# Patient Record
Sex: Male | Born: 1960 | Race: White | Hispanic: No | Marital: Married | Smoking: Former smoker
Health system: Southern US, Community
[De-identification: ages and names within clinical notes are randomized; demographics above are authoritative.]

## PROBLEM LIST (undated history)

## (undated) DIAGNOSIS — K219 Gastro-esophageal reflux disease without esophagitis: Secondary | ICD-10-CM

## (undated) DIAGNOSIS — G459 Transient cerebral ischemic attack, unspecified: Secondary | ICD-10-CM

## (undated) DIAGNOSIS — Q211 Atrial septal defect: Secondary | ICD-10-CM

## (undated) DIAGNOSIS — U071 COVID-19: Secondary | ICD-10-CM

## (undated) DIAGNOSIS — H269 Unspecified cataract: Secondary | ICD-10-CM

## (undated) DIAGNOSIS — G4730 Sleep apnea, unspecified: Secondary | ICD-10-CM

## (undated) DIAGNOSIS — E785 Hyperlipidemia, unspecified: Secondary | ICD-10-CM

## (undated) DIAGNOSIS — I10 Essential (primary) hypertension: Secondary | ICD-10-CM

## (undated) DIAGNOSIS — Q2112 Patent foramen ovale: Secondary | ICD-10-CM

## (undated) DIAGNOSIS — N189 Chronic kidney disease, unspecified: Secondary | ICD-10-CM

## (undated) HISTORY — DX: Sleep apnea, unspecified: G47.30

## (undated) HISTORY — DX: Unspecified cataract: H26.9

## (undated) HISTORY — DX: Atrial septal defect: Q21.1

## (undated) HISTORY — DX: Essential (primary) hypertension: I10

## (undated) HISTORY — DX: Gastro-esophageal reflux disease without esophagitis: K21.9

## (undated) HISTORY — PX: POLYPECTOMY: SHX149

## (undated) HISTORY — PX: COLONOSCOPY: SHX174

## (undated) HISTORY — DX: Patent foramen ovale: Q21.12

## (undated) HISTORY — PX: LAMINECTOMY: SHX219

## (undated) HISTORY — PX: HERNIA REPAIR: SHX51

## (undated) HISTORY — DX: Hyperlipidemia, unspecified: E78.5

## (undated) HISTORY — DX: Chronic kidney disease, unspecified: N18.9

## (undated) HISTORY — DX: COVID-19: U07.1

---

## 2000-03-28 DIAGNOSIS — G4730 Sleep apnea, unspecified: Secondary | ICD-10-CM | POA: Insufficient documentation

## 2001-06-26 DIAGNOSIS — R319 Hematuria, unspecified: Secondary | ICD-10-CM | POA: Insufficient documentation

## 2001-06-26 DIAGNOSIS — Z720 Tobacco use: Secondary | ICD-10-CM | POA: Insufficient documentation

## 2003-12-05 ENCOUNTER — Encounter: Admission: RE | Admit: 2003-12-05 | Discharge: 2003-12-05 | Payer: Self-pay | Admitting: Pain Medicine

## 2006-08-22 ENCOUNTER — Ambulatory Visit (HOSPITAL_COMMUNITY): Admission: RE | Admit: 2006-08-22 | Discharge: 2006-08-22 | Payer: Self-pay | Admitting: General Surgery

## 2008-04-08 ENCOUNTER — Emergency Department (HOSPITAL_COMMUNITY): Admission: EM | Admit: 2008-04-08 | Discharge: 2008-04-08 | Payer: Self-pay | Admitting: Emergency Medicine

## 2008-04-08 ENCOUNTER — Ambulatory Visit: Payer: Self-pay | Admitting: Internal Medicine

## 2011-03-05 NOTE — Consult Note (Signed)
NAME:  Gabriel Lin, Gabriel Lin NO.:  [id number]   MEDICAL RECORD NO.:  [id number]          PATIENT TYPE:  EMS   LOCATION:  ED                           FACILITY:  [facility]   PHYSICIAN:  Bevelyn Buckles. Bensimhon, MDDATE OF BIRTH:  1960/12/04   DATE OF CONSULTATION:  04/08/2008  DATE OF DISCHARGE:                                 CONSULTATION   CONSULTING PHYSICIAN:  [facility].   REASON FOR CONSULTATION:  Chest pain.   HISTORY:  Gabriel Lin is a very pleasant 50 year old male with a  history of obesity, hypertension, obstructive sleep apnea,  hyperlipidemia and ongoing tobacco use.  He denies any history of known  heart disease.  He has never had a cardiac catheterization previously or  stress test.  Last Saturday, he went to the [facility] and ate a very  large greasy breakfast.  About 1 or 1-/12 hours after this, he developed  severe lower sternal epigastric pain radiating up to his neck.  He felt  sick to his stomach.  He went to [facility] and took some baby aspirin, and  felt a little better.  However the next morning, he noticed this gravely  feeling in the back of his throat which is consistent with his reflux  disease.  So then he went out and started some Prilosec.   Since that time, he has been feeling pretty well.  He has been  ambulating without any chest pain or shortness of breath.  Over the past  2 days, he has developed some vague tingling in his left hand.  He  denied any change in his vision, his speech or his motor strength.  He  became quite anxious about this and was worried he might be having a  heart attack.  He then got a little bit of chest discomfort, so he came  to emergency room.  In the emergency room, his EKG showed sinus  tachycardia at a rate of 105.  His blood pressure was elevated initially  at 179/117.  He was given something for anxiety and now feels better.   REVIEW OF SYSTEMS:  As per HPI and problem list, otherwise negative.   He  denies any melena.  No bright red blood per rectum.  No hematemesis.  No  nausea, vomiting or diarrhea.   PAST MEDICAL HISTORY:  1. Obesity.  2. Hypertension.  3. Obstructive sleep apnea.  4. Hyperlipidemia.  5. Gastroesophageal reflux disease.   PAST SURGICAL HISTORY:  1. Status post bilateral inguinal hernia repairs.  2. Status post back surgery.   CURRENT MEDICATIONS:  1. Prilosec.  2. Aspirin.   ALLERGIES:  NO KNOWN ALLERGIES.   SOCIAL HISTORY:  He lives in [place].  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.  He has a history of tobacco just over  a pack a day for 25 years.  Minimal alcohol.   FAMILY HISTORY:  Father is alive and well at 50.  Mother has diabetes.  She is alive.  Brother and sister are alive and well.  There is no  family history of premature coronary artery disease.   PHYSICAL EXAMINATION:  GENERAL:  He is in no acute distress.  He does  seem like a high-stress guy, but is currently comfortable.  VITAL SIGNS:  Blood pressure is now 150/81, heart rate is 90.  He is  satting 98% on 2 liters.  He is afebrile.  Weight is 223 pounds.  HEENT:  Normal.  NECK:  Supple.  There is no JVD.  Carotids are 2+ bilaterally without  bruits.  There is no lymphadenopathy or thyromegaly.  CARDIAC:  PMI is nondisplaced.  He is regular with no murmurs, rubs or  gallops.  LUNGS:  Clear.  ABDOMEN:  Obese, nontender, nondistended.  No hepatosplenomegaly.  No  bruits.  No masses.  Good bowel sounds.  EXTREMITIES:  Warm with no cyanosis, clubbing or edema.  No rash.  Good  distal pulses.  NEURO:  Alert and oriented x3.  Cranial nerves II-XII are intact.  Moves  all 4 extremities without difficulty.  Affect is pleasant.   DIAGNOSTICS:  1. Chest x-ray is normal.  2. EKG shows sinus tachycardia at a rate of 105, no ST/T wave changes.   LABORATORY DATA:  Point of care labs shows hemoglobin 18.7, hematocrit  of 55, sodium 137, potassium 4.0, BUN  20, creatinine 1.3, glucose 102.  He has 2 sets of cardiac markers which are totally normal with a  troponin of less than 0.05.   ASSESSMENT:  1. Chest and epigastric pain.  2. Tobacco use.  3. Hypertension.  4. Anxiety.  5. Gastroesophageal reflux disease.   DISPOSITION:  1. Although Gabriel Lin certainly has risk factors for coronary artery      disease, I think his symptoms are most consistent with reflux      disease.  There is no current evidence of ischemia.  I think he is      safe for discharge.  We will have him double up on his Prilosec to      make it 20 b.i.d.  He will continue his aspirin.  I strongly      advised him to stop smoking and be more careful about his diet.      Given his risk factors, he will need a stress test.  However, he is      not going to have insurance until August.  I think he is safe to      wait until then unless he has recurrent symptoms.  If he does so,      he needs to call me.  We will give him the office number.  We will      also start him on      low-dose Xanax p.r.n. for anxiety.  2. Hypertension.  This is markedly elevated probably due to anxiety.      We will start him on Norvasc 2.5 mg a day.  We did consider a beta      blocker, but he was reluctant given the side effects of erectile      dysfunction.      Bevelyn Buckles. Bensimhon, MD  Electronically Signed     DRB/MEDQ  D:  04/08/2008  T:  04/08/2008  Job:  [id number]

## 2011-03-08 NOTE — Op Note (Signed)
NAME:  Gabriel Lin, EKSTEIN NO.:  [id number]   MEDICAL RECORD NO.:  [id number]          PATIENT TYPE:  AMB   LOCATION:  SDS                          FACILITY:  [facility]   PHYSICIAN:  Ollen Gross. Vernell Morgans, M.D. DATE OF BIRTH:  May 02, 1961   DATE OF PROCEDURE:  08/22/2006  DATE OF DISCHARGE:  08/22/2006                               OPERATIVE REPORT   PREOPERATIVE DIAGNOSIS:  Bilateral inguinal herniae.   POSTOPERATIVE DIAGNOSIS:  Bilateral inguinal herniae.   PROCEDURE:  Bilateral inguinal hernia repairs with mesh.   SURGEON:  Ollen Gross. Carolynne Edouard, M.D.   ANESTHESIA:  General.   DESCRIPTION OF PROCEDURE:  After informed consent was obtained, the  patient was brought to the operating and placed in the supine position  on the operating table.  After induction of general anesthesia, the  patient's abdomen and both groins were prepped with Betadine and draped  in the usual sterile manner.  Attention was turned first to the left  groin. This area was infiltrated with 0.25% Marcaine with epinephrine.  A small incision was made from the edge of the pubic tubercle on the  left towards the anterior superior iliac spine for a distance of about 5  cm. This incision was carried down through the skin and subcutaneous  tissue sharply with electrocautery.  A small bridging vein was clamped  with hemostats, divided, and ligated with 3-0 silk ties.  The dissection  was then carried deep through the subcutaneous tissue sharply with  electrocautery until the fascia of the external oblique was encountered.  The fascia of the external oblique was opened along its fibers towards  the apex of the external ring.  A Weitlaner retractor was deployed.  Blunt dissection was carried out of the cord structures at the edge of  the pubic tubercle until the cord structures could be surrounded between  two fingers.  A 1/2-inch Penrose drain was then placed around the cord  structures for retraction purposes.  The  cord was gently skeletonized.  The patient had a direct defect but no indirect defect.  The floor of  the hernia sac was then reduced easily.  The floor of the inguinal canal  was then reapproximated with a running 2-0 Prolene stitch.  The tails of  the stitch were left long at the edge of the cord.  Next, a 3 by 6 piece  of Prolene mesh was chosen and cut to fit. The mesh was sewed inferiorly  to the shelving edge of the inguinal ligament with a running 2-0 Prolene  stitch.  The tails were cut in the mesh laterally and the long ends of  the Prolene that was used to repair the floor of the canal were brought  through the mesh at the edge of the cut tails and tied. The tails were  wrapped around the cord structures laterally.  The mesh was then sewed  superiorly to the transversalis with interrupted 2-0 Prolene vertical  mattress stitches.  The tails of the mesh were anchored lateral to the  cord to the shelving edge of inguinal ligament with interrupted  2-0  Prolene stitch.  Once this was accomplished, the mesh was in good  position without any tension.  The ilioinguinal nerve was identified and  was clamped proximally and distally with hemostats, divided and ligated  with 3-0 silk ties.  The wound was then irrigated with copious amounts  of saline.  The external oblique was then reapproximated with a running  2-0 Vicryl stitch.  The subcutaneous tissue was reapproximated with a  running 3-0 Vicryl stitch.  The skin was closed with a running 4-0  Monocryl subcuticular stitch.   Attention was then turned to the right side and, again, the right groin  was infiltrated 0.25% Marcaine with epinephrine.  A small incision was  made from the edge of the pubic tubercle on the right towards the  anterior superior spine for a distance of about 5 cm. This incision was  carried down through the skin and subcutaneous tissue sharply with  electrocautery until the fascia of the external oblique was  encountered.  A small bridging vein was clamped with hemostats, divided and ligated  with 3-0 silk ties.  The external oblique was opened along its fibers  towards the apex of the external ring with a 15 blade knife and  Metzenbaum scissors.  A Weitlaner retractor was then deployed.  Blunt  dissection was then carried out at the edge of the pubic tubercle until  the cord structures could be surrounded between two fingers.  A 1/2-inch  Penrose drain was then placed around the cord structures for retraction  purposes.  The cord was skeletonized and there appeared to be, again,  another direct defect but no indirect sac.  The direct hernia was then  easily reduced and the floor of the inguinal canal was repaired with a  running 2-0 Prolene stitch.  The tails of the Prolene were left long at  the edge of the cord.  A piece of about 3 x 6 Prolene mesh was chosen  and cut to fit. The mesh was sewed inferiorly to the shelving edge of  the inguinal ligament with running 2-0 Prolene stitch.  The tails were  cut in the mesh laterally and the long ends of the Prolene at the edge  of the cord were brought through the mesh and tied.  The mesh was then  sewed superiorly to the transversalis with interrupted 2-0 Prolene  vertical mattress stitches.  The tails of mesh were wrapped laterally  around the cord and anchored lateral to the cord to the shelving edge of  the inguinal ligament with interrupted 2-0 Prolene stitch.  The  ilioinguinal nerve was also identified and was clamped proximally and  distally with hemostats, divided, and ligated 3-0 silk ties.  The wound  was then irrigated with copious amounts of saline. The external oblique  fascia was closed with a running 2-0 Vicryl stitch.  The subcutaneous  fascia was closed with a running 3-0 Vicryl stitch.  The skin was closed  with a running 4-0 Monocryl subcuticular stitch.  The rest of the 0.25% Marcaine was infiltrated into both wound before the  final closure.  Benzoin, Steri-Strips and sterile dressings were applied.  The patient  tolerated the procedure well.  At the end of the case, all needle,  sponge, and instrument counts were correct.  The patient was then  awakened and taken to recovery in stable condition.  The patient's  testicles were in the scrotum at the end of the case.  Ollen Gross. Vernell Morgans, M.D.  Electronically Signed     PST/MEDQ  D:  08/27/2006  T:  08/27/2006  Job:  [id number]

## 2011-07-18 LAB — POCT I-STAT, CHEM 8
Chloride: 106
Creatinine, Ser: 1.3
Glucose, Bld: 102 — ABNORMAL HIGH
Potassium: 4
Sodium: 137

## 2011-07-18 LAB — POCT CARDIAC MARKERS
CKMB, poc: <1 — ABNORMAL LOW
Myoglobin, poc: 109
Myoglobin, poc: 68.1
Operator id: 264031
Operator id: 264031

## 2011-09-23 DIAGNOSIS — E7800 Pure hypercholesterolemia, unspecified: Secondary | ICD-10-CM | POA: Insufficient documentation

## 2011-09-23 DIAGNOSIS — K219 Gastro-esophageal reflux disease without esophagitis: Secondary | ICD-10-CM | POA: Insufficient documentation

## 2012-08-17 DIAGNOSIS — I10 Essential (primary) hypertension: Secondary | ICD-10-CM | POA: Insufficient documentation

## 2016-05-10 DIAGNOSIS — F418 Other specified anxiety disorders: Secondary | ICD-10-CM | POA: Insufficient documentation

## 2016-07-24 DIAGNOSIS — Z79899 Other long term (current) drug therapy: Secondary | ICD-10-CM | POA: Insufficient documentation

## 2016-07-24 DIAGNOSIS — Z7185 Encounter for immunization safety counseling: Secondary | ICD-10-CM | POA: Insufficient documentation

## 2017-05-07 DIAGNOSIS — N183 Chronic kidney disease, stage 3 unspecified: Secondary | ICD-10-CM | POA: Insufficient documentation

## 2018-08-17 DIAGNOSIS — K760 Fatty (change of) liver, not elsewhere classified: Secondary | ICD-10-CM | POA: Insufficient documentation

## 2020-02-17 ENCOUNTER — Telehealth: Payer: Self-pay | Admitting: Internal Medicine

## 2020-02-17 NOTE — Telephone Encounter (Signed)
Hey Dr. Carlean Purl- this patient is being referred to [place] for a colonoscopy. Last colonoscopy mwas at [facility] in 2016. This patient is requesting you as hes doctor. I am going to send you the records for review. Please advise. Thank you!

## 2020-02-22 NOTE — Telephone Encounter (Signed)
Called patient to schedule- phone rang a few times and went to busy signal. Will try back to get patient scheduled.

## 2020-02-22 NOTE — Telephone Encounter (Signed)
Sent papers back to you - ok to schedule colonoscopy

## 2020-04-19 ENCOUNTER — Encounter: Payer: Self-pay | Admitting: Internal Medicine

## 2020-04-26 ENCOUNTER — Emergency Department (HOSPITAL_COMMUNITY): Payer: BC Managed Care – PPO

## 2020-04-26 ENCOUNTER — Other Ambulatory Visit: Payer: Self-pay

## 2020-04-26 ENCOUNTER — Encounter (HOSPITAL_COMMUNITY): Payer: Self-pay | Admitting: Emergency Medicine

## 2020-04-26 ENCOUNTER — Emergency Department (HOSPITAL_COMMUNITY)
Admission: EM | Admit: 2020-04-26 | Discharge: 2020-04-26 | Disposition: A | Discharge status: Home / Self Care | Payer: BC Managed Care – PPO | Attending: Emergency Medicine | Admitting: Emergency Medicine

## 2020-04-26 DIAGNOSIS — N189 Chronic kidney disease, unspecified: Secondary | ICD-10-CM | POA: Diagnosis not present

## 2020-04-26 DIAGNOSIS — R079 Chest pain, unspecified: Secondary | ICD-10-CM | POA: Insufficient documentation

## 2020-04-26 DIAGNOSIS — I129 Hypertensive chronic kidney disease with stage 1 through stage 4 chronic kidney disease, or unspecified chronic kidney disease: Secondary | ICD-10-CM | POA: Insufficient documentation

## 2020-04-26 DIAGNOSIS — R030 Elevated blood-pressure reading, without diagnosis of hypertension: Secondary | ICD-10-CM

## 2020-04-26 LAB — CBC
HCT: 50.3 % (ref 39.0–52.0)
Hemoglobin: 17.6 g/dL — ABNORMAL HIGH (ref 13.0–17.0)
MCH: 32 pg (ref 26.0–34.0)
MCHC: 35 g/dL (ref 30.0–36.0)
MCV: 91.5 fL (ref 80.0–100.0)
Platelets: 165 10*3/uL (ref 150–400)
RBC: 5.5 MIL/uL (ref 4.22–5.81)
RDW: 12.3 % (ref 11.5–15.5)
WBC: 7.2 10*3/uL (ref 4.0–10.5)
nRBC: 0 % (ref 0.0–0.2)

## 2020-04-26 LAB — BASIC METABOLIC PANEL
Anion gap: 9 (ref 5–15)
BUN: 30 mg/dL — ABNORMAL HIGH (ref 6–20)
CO2: 27 mmol/L (ref 22–32)
Calcium: 9.1 mg/dL (ref 8.9–10.3)
Chloride: 100 mmol/L (ref 98–111)
Creatinine, Ser: 1.53 mg/dL — ABNORMAL HIGH (ref 0.61–1.24)
GFR calc Af Amer: 56 mL/min — ABNORMAL LOW (ref >60)
GFR calc non Af Amer: 49 mL/min — ABNORMAL LOW (ref >60)
Glucose, Bld: 96 mg/dL (ref 70–99)
Potassium: 4.5 mmol/L (ref 3.5–5.1)
Sodium: 136 mmol/L (ref 135–145)

## 2020-04-26 LAB — TROPONIN I (HIGH SENSITIVITY)
Troponin I (High Sensitivity): 3 ng/L (ref <18)
Troponin I (High Sensitivity): 3 ng/L (ref <18)

## 2020-04-26 MED ORDER — SODIUM CHLORIDE 0.9% FLUSH: 3.0000 mL | Freq: Once | INTRAVENOUS | Status: DC

## 2020-04-26 NOTE — ED Provider Notes (Signed)
[place] DEPT Provider Note   CSN: [id number] Arrival date & time: 04/26/20  1318     History Chief Complaint  Patient presents with  . chest pain    Gabriel Lin is a 59 y.o. male with a past medical history significant for hypertension, CKD, and GERD who presents to the ED due to constant, non-radiating, left-sided chest pain that started this morning around 11:30 AM.  Patient states he was sitting at a restaurant and began to feel left-sided chest pain which he describes as a "stick" feeling.  Patient notes he has had similar chest pain in the past which is typically relieved by passing gas.  Patient notes pain waxed and waned over the past few hours. He took a Copy powder with moderate relief.  Denies association with exertion.  Denies associated nausea, vomiting, diaphoresis, and shortness of breath.  Denies lower extremity edema.  Denies history of blood clots, recent surgeries, recent long immobilizations, and hormonal treatments.  Patient notes he passed gas here in the waiting room which relieved his chest pain completely.  Denies abdominal pain.  He notes this episode of chest pain feels similar to his past episodes; however, he reported to the ER due to the duration and symptoms.  Patient denies tobacco, drug, alcohol use.  Denies family history of early CAD.  She is currently chest pain free and notes he has not had any chest pain in the past 3 hours.  History obtained from patient and past medical records. No interpreter used during encounter.      History reviewed. No pertinent past medical history.  There are no problems to display for this patient.   History reviewed. No pertinent surgical history.     No family history on file.  Social History   Tobacco Use  . Smoking status: Not on file  Substance Use Topics  . Alcohol use: Not on file  . Drug use: Not on file    Home Medications Prior to Admission medications   Not on File     Allergies    Patient has no allergy information on record.  Review of Systems   Review of Systems  Constitutional: Negative for chills, diaphoresis and fever.  Respiratory: Negative for shortness of breath.   Cardiovascular: Positive for chest pain. Negative for leg swelling.  Gastrointestinal: Negative for abdominal pain, diarrhea, nausea and vomiting.  Musculoskeletal: Negative for back pain.  Neurological: Negative for dizziness and headaches.  All other systems reviewed and are negative.   Physical Exam Updated Vital Signs BP (!) 166/102 (BP Location: Left Arm)   Pulse 75   Temp 98.8 F (37.1 C) (Oral)   Resp 16   SpO2 98%   Physical Exam Vitals and nursing note reviewed.  Constitutional:      General: He is not in acute distress.    Appearance: He is not ill-appearing.  HENT:     Head: Normocephalic.  Eyes:     Pupils: Pupils are equal, round, and reactive to light.  Cardiovascular:     Rate and Rhythm: Normal rate and regular rhythm.     Pulses: Normal pulses.     Heart sounds: Normal heart sounds. No murmur heard.  No friction rub. No gallop.   Pulmonary:     Effort: Pulmonary effort is normal.     Breath sounds: Normal breath sounds.  Chest:     Comments: Reproducible left sided chest pain below left breast. No crepitus or deformity.  Abdominal:  General: Abdomen is flat. Bowel sounds are normal. There is no distension.     Palpations: Abdomen is soft.     Tenderness: There is no abdominal tenderness. There is no guarding or rebound.     Comments: Abdomen soft, nondistended, nontender to palpation in all quadrants without guarding or peritoneal signs. No rebound.   Musculoskeletal:     Cervical back: Neck supple.     Comments: No lower extremity edema.  Negative Homans' sign bilaterally.  No calf tenderness.  Skin:    General: Skin is warm and dry.  Neurological:     General: No focal deficit present.     Mental Status: He is alert.    Psychiatric:        Mood and Affect: Mood normal.        Behavior: Behavior normal.     ED Results / Procedures / Treatments   Labs (all labs ordered are listed, but only abnormal results are displayed) Labs Reviewed  BASIC METABOLIC PANEL - Abnormal; Notable for the following components:      Result Value   BUN 30 (*)    Creatinine, Ser 1.53 (*)    GFR calc non Af Amer 49 (*)    GFR calc Af Amer 56 (*)    All other components within normal limits  CBC - Abnormal; Notable for the following components:   Hemoglobin 17.6 (*)    All other components within normal limits  TROPONIN I (HIGH SENSITIVITY)  TROPONIN I (HIGH SENSITIVITY)    EKG EKG Interpretation  Date/Time:  Wednesday April 26 2020 13:27:18 EDT Ventricular Rate:  73 PR Interval:    QRS Duration: 89 QT Interval:  375 QTC Calculation: 414 R Axis:   45 Text Interpretation: Sinus rhythm Low voltage, precordial leads RSR' in V1 or V2, probably normal variant Since last tracing rate slower Confirmed by Dorie Rank [phone]) on 04/26/2020 6:13:31 PM   Radiology DG Chest 2 View  Result Date: 04/26/2020 CLINICAL DATA:  Chest pain EXAM: CHEST - 2 VIEW COMPARISON:  None. FINDINGS: Lungs are clear. The heart size and pulmonary vascularity are normal. No adenopathy. No pneumothorax. No bone lesions. IMPRESSION: Lungs clear.  Cardiac silhouette normal. Electronically Signed   By: Lowella Grip III M.D.   On: 04/26/2020 15:10    Procedures Procedures (including critical care time)  Medications Ordered in ED Medications  sodium chloride flush (NS) 0.9 % injection 3 mL (has no administration in time range)    ED Course  I have reviewed the triage vital signs and the nursing notes.  Pertinent labs & imaging results that were available during my care of the patient were reviewed by me and considered in my medical decision making (see chart for details).  Clinical Course as of Apr 26 1829  Wed Apr 26, 2020  1814 BP(!):  146/87 [CA]    Clinical Course User Index [CA] Karie Kirks   MDM Rules/Calculators/A&P                         59 year old male presents to the ED due to left-sided, nonradiating, constant chest pain that started this morning at 11:30 AM.  Patient passed gas here in the waiting room and has been chest pain-free for the past 3 hours.  Denies history of blood clots, recent surgeries, recent long immobilizations, and hormonal treatments.  Upon arrival, patient is afebrile, not tachycardic or hypoxic.  Elevated BP at 171/87.  Patient has a history of hypertension.  During my initial evaluation after 4.5 hours of waiting BP 160/90 which demonstrates mild improvement.  Low suspicion for hypertensive emergency/urgency.  Patient in no acute distress and non-ill-appearing.  Physical exam reassuring.  Reproducible left-sided anterior chest wall tenderness below the left breast.  No crepitus or deformity.  No lower extremity edema.  Negative Homans' sign bilaterally.  Low suspicion for PE/DVT.  Routine labs, chest x-ray, EKG, and troponin ordered at triage.  CBC reassuring with no leukocytosis.  BMP significant for creatinine at 1.53 and BUN at 30.  Patient admits to having a history of CKD which is likely the cause of his renal function.  No major electrolyte derangements.  Delta troponin flat.  Low suspicion for ACS.  Chest x-ray personally reviewed which is negative for signs of pneumonia, pneumothorax, or widened mediastinum.  EKG personally reviewed which demonstrates normal sinus rhythm with no signs of acute ischemia. Dissection was considered, but thought to be less likely given presentation and history. Chest pain appears atypical in nature with a possible GI etiology. Patient has been chest pain free for over 3 hours. Will discharge patient to cardiology referral given numerous episodes of past chest pain and HTN.  Instructed patient to have blood pressure rechecked within the next week due to  elevated blood pressure readings here in the ER.  Strict ED precautions discussed with patient. Patient states understanding and agrees to plan. Patient discharged home in no acute distress and stable vitals  Final Clinical Impression(s) / ED Diagnoses Final diagnoses:  Nonspecific chest pain  Elevated blood pressure reading    Rx / DC Orders ED Discharge Orders    None       Karie Kirks 04/26/20 1831    Dorie Rank, MD 04/27/20 1051

## 2020-04-26 NOTE — Discharge Instructions (Addendum)
As discussed, all your labs are reassuring today.  Your cardiac markers were normal indicating your chest pain was not related to a heart attack. I suspect your chest pain is related to GI etiology; however, I have included the number of the cardiologist. Please call tomorrow to schedule an appointment for further evaluation. Return to the ER for new or worsening symptoms.   Also, your blood pressure was elevated here in the ED.  Please have your blood pressure rechecked within the next week for further evaluation.

## 2020-04-26 NOTE — ED Triage Notes (Signed)
Pt c/o left side chest pains that started around 1130am today. Reports has lightened up.  Took a Goody's medications when pain started.

## 2020-05-31 ENCOUNTER — Telehealth: Payer: Self-pay | Admitting: *Deleted

## 2020-05-31 NOTE — Telephone Encounter (Signed)
ED notes reviewed  His sxs abated after passing gas   I am ok with proceeding to colonoscopy as planned and do not think needs cardiology evaluation  Thanks

## 2020-05-31 NOTE — Telephone Encounter (Signed)
Patient is returning your call.  

## 2020-05-31 NOTE — Telephone Encounter (Signed)
Dr.Gessner,  The patient is for a colonoscopy with you on 06/23/2020. Hx colon polyps last colon 2016 in [place]. The patient was seen in the ED for chest pain on 04/26/20. I called the patient to ask about this and to see if he has f/u with cardiologist. The patient states that he believes his chest pain was all related to "gas, and heart burn" and once he passed the gas he had no chest pain. Patient seen a cardiologist in 2011 in [place] and did the stress test and echo and the tests was normal per pt.  Patient does not feel that he needs to see a cardiologist at this time. Ok to proceed with colonoscopy at [facility] as scheduled? Please advise. Thank you, Sorina Derrig pv

## 2020-05-31 NOTE — Telephone Encounter (Signed)
Called patient, no answer.  Left message for the patient to call us back. Due to his recent ED visit on 04/26/20 for chest pain and referral to follow up with cardilogist he will need to have that completed BEFORE colonoscopy.

## 2020-05-31 NOTE — Telephone Encounter (Signed)
Patient called and given Dr.Gessner's recommendations. Pt was very happy with this.

## 2020-06-09 ENCOUNTER — Ambulatory Visit (AMBULATORY_SURGERY_CENTER): Payer: Self-pay | Admitting: *Deleted

## 2020-06-09 ENCOUNTER — Other Ambulatory Visit: Payer: Self-pay

## 2020-06-09 ENCOUNTER — Encounter: Payer: Self-pay | Admitting: Internal Medicine

## 2020-06-09 VITALS — Ht 72.0 in | Wt 241.4 lb

## 2020-06-09 DIAGNOSIS — Z800 Family history of malignant neoplasm of digestive organs: Secondary | ICD-10-CM

## 2020-06-09 DIAGNOSIS — Z86010 Personal history of colonic polyps: Secondary | ICD-10-CM

## 2020-06-09 NOTE — Progress Notes (Signed)
J and J covid vaccine received 01-29-20  Pt is aware that care partner will wait in the car during procedure; if they feel like they will be too hot or cold to wait in the car; they may wait in the 4 th floor lobby. Patient is aware to bring only one care partner. We want them to wear a mask (we do not have any that we can provide them), practice social distancing, and we will check their temperatures when they get here.  I did remind the patient that their care partner needs to stay in the parking lot the entire time and have a cell phone available, we will call them when the pt is ready for discharge. Patient will wear mask into building.   No egg or soy allergy  No home oxygen use   No medications for weight loss taken  Pt denies constipation issues   No trouble with anesthesia, difficulty with intubation or hx/fam hx of malignant hyperthermia per pt

## 2020-06-23 ENCOUNTER — Encounter: Payer: Self-pay | Admitting: Internal Medicine

## 2020-06-23 ENCOUNTER — Ambulatory Visit (AMBULATORY_SURGERY_CENTER): Payer: BC Managed Care – PPO | Admitting: Internal Medicine

## 2020-06-23 ENCOUNTER — Other Ambulatory Visit: Payer: Self-pay

## 2020-06-23 VITALS — BP 109/72 | HR 67 | Temp 98.0°F | Resp 16 | Ht 72.0 in | Wt 241.4 lb

## 2020-06-23 DIAGNOSIS — D123 Benign neoplasm of transverse colon: Secondary | ICD-10-CM

## 2020-06-23 DIAGNOSIS — D122 Benign neoplasm of ascending colon: Secondary | ICD-10-CM

## 2020-06-23 DIAGNOSIS — Z800 Family history of malignant neoplasm of digestive organs: Secondary | ICD-10-CM | POA: Diagnosis not present

## 2020-06-23 DIAGNOSIS — Z860100 Personal history of colon polyps, unspecified: Secondary | ICD-10-CM

## 2020-06-23 DIAGNOSIS — D125 Benign neoplasm of sigmoid colon: Secondary | ICD-10-CM

## 2020-06-23 DIAGNOSIS — D124 Benign neoplasm of descending colon: Secondary | ICD-10-CM | POA: Diagnosis not present

## 2020-06-23 MED ORDER — SODIUM CHLORIDE 0.9 % IV SOLN: 500.0000 mL | Freq: Once | INTRAVENOUS | Status: DC

## 2020-06-23 NOTE — Progress Notes (Signed)
Pt's states no medical or surgical changes since previsit or office visit. 

## 2020-06-23 NOTE — Progress Notes (Signed)
Report given to PACU, vss 

## 2020-06-23 NOTE — Patient Instructions (Addendum)
I found and removed 12 small polyps.  You also have a condition called diverticulosis - common and not usually a problem. Please read the handout provided.  I will let you know pathology results and when to have another routine colonoscopy by mail and/or My Chart.  I appreciate the opportunity to care for you. Gatha Mayer, MD, [facility]   Polyp handout given to patient. Diverticulosis handout given to patient. Hemorrhoid handout given to patient.   YOU HAD AN ENDOSCOPIC PROCEDURE TODAY AT [facility]:   Refer to the procedure report that was given to you for any specific questions about what was found during the examination.  If the procedure report does not answer your questions, please call your gastroenterologist to clarify.  If you requested that your care partner not be given the details of your procedure findings, then the procedure report has been included in a sealed envelope for you to review at your convenience later.  YOU SHOULD EXPECT: Some feelings of bloating in the abdomen. Passage of more gas than usual.  Walking can help get rid of the air that was put into your GI tract during the procedure and reduce the bloating. If you had a lower endoscopy (such as a colonoscopy or flexible sigmoidoscopy) you may notice spotting of blood in your stool or on the toilet paper. If you underwent a bowel prep for your procedure, you may not have a normal bowel movement for a few days.  Please Note:  You might notice some irritation and congestion in your nose or some drainage.  This is from the oxygen used during your procedure.  There is no need for concern and it should clear up in a day or so.  SYMPTOMS TO REPORT IMMEDIATELY:   Following lower endoscopy (colonoscopy or flexible sigmoidoscopy):  Excessive amounts of blood in the stool  Significant tenderness or worsening of abdominal pains  Swelling of the abdomen that is new, acute  Fever of 100F or higher   For  urgent or emergent issues, a gastroenterologist can be reached at any hour by calling [phone]. Do not use MyChart messaging for urgent concerns.    DIET:  We do recommend a small meal at first, but then you may proceed to your regular diet.  Drink plenty of fluids but you should avoid alcoholic beverages for 24 hours.  ACTIVITY:  You should plan to take it easy for the rest of today and you should NOT DRIVE or use heavy machinery until tomorrow (because of the sedation medicines used during the test).    FOLLOW UP: Our staff will call the number listed on your records 48-72 hours following your procedure to check on you and address any questions or concerns that you may have regarding the information given to you following your procedure. If we do not reach you, we will leave a message.  We will attempt to reach you two times.  During this call, we will ask if you have developed any symptoms of COVID 19. If you develop any symptoms (ie: fever, flu-like symptoms, shortness of breath, cough etc.) before then, please call [phone].  If you test positive for Covid 19 in the 2 weeks post procedure, please call and report this information to [place].    If any biopsies were taken you will be contacted by phone or by letter within the next 1-3 weeks.  Please call us at [phone] if you have not heard about the biopsies in  3 weeks.    SIGNATURES/CONFIDENTIALITY: You and/or your care partner have signed paperwork which will be entered into your electronic medical record.  These signatures attest to the fact that that the information above on your After Visit Summary has been reviewed and is understood.  Full responsibility of the confidentiality of this discharge information lies with you and/or your care-partner.

## 2020-06-23 NOTE — Progress Notes (Signed)
Called to room to assist during endoscopic procedure.  Patient ID and intended procedure confirmed with present staff. Received instructions for my participation in the procedure from the performing physician.  

## 2020-06-23 NOTE — Op Note (Signed)
Gabriel Lin Patient Name: Gabriel Lin Procedure Date: 06/23/2020 8:38 AM MRN: [id number] Endoscopist: Gatha Mayer , MD Age: 59 Referring MD:  Date of Birth: 1960/12/13 Gender: Male Account #: [id number] Procedure:                Colonoscopy Indications:              Screening in patient at increased risk: Colorectal                            cancer in brother before age 11 Medicines:                Propofol per Anesthesia, Monitored Anesthesia Care Procedure:                Pre-Anesthesia Assessment:                           - Prior to the procedure, a History and Physical                            was performed, and patient medications and                            allergies were reviewed. The patient's tolerance of                            previous anesthesia was also reviewed. The risks                            and benefits of the procedure and the sedation                            options and risks were discussed with the patient.                            All questions were answered, and informed consent                            was obtained. Prior Anticoagulants: The patient has                            taken no previous anticoagulant or antiplatelet                            agents. ASA Grade Assessment: II - A patient with                            mild systemic disease. After reviewing the risks                            and benefits, the patient was deemed in                            satisfactory condition to undergo the procedure.  After obtaining informed consent, the colonoscope                            was passed under direct vision. Throughout the                            procedure, the patient's blood pressure, pulse, and                            oxygen saturations were monitored continuously. The                            Colonoscope was introduced through the anus and                             advanced to the the cecum, identified by                            appendiceal orifice and ileocecal valve. The                            colonoscopy was performed without difficulty. The                            patient tolerated the procedure well. The quality                            of the bowel preparation was good. The bowel                            preparation used was Miralax via split dose                            instruction. The ileocecal valve, appendiceal                            orifice, and rectum were photographed. Scope In: 8:48:23 AM Scope Out: 9:07:27 AM Scope Withdrawal Time: 0 hours 16 minutes 36 seconds  Total Procedure Duration: 0 hours 19 minutes 4 seconds  Findings:                 Hemorrhoids were found on perianal exam.                           The digital rectal exam findings include                            non-thrombosed external hemorrhoids. Pertinent                            negatives include normal prostate (size, shape, and                            consistency).  Twelve (12) sessile polyps were found in the                            sigmoid colon, descending colon, transverse colon                            and ascending colon. The polyps were diminutive in                            size. These polyps were removed with a cold snare.                            Resection and retrieval were complete. Verification                            of patient identification for the specimen was                            done. Estimated blood loss was minimal.                           Multiple small and large-mouthed diverticula were                            found in the sigmoid colon.                           External hemorrhoids were found.                           The exam was otherwise without abnormality on                            direct and retroflexion views. Complications:            No immediate  complications. Estimated Blood Loss:     Estimated blood loss was minimal. Impression:               - Hemorrhoids found on perianal exam.                           - Non-thrombosed external hemorrhoids found on                            digital rectal exam.                           - Twelve (12) diminutive polyps in the sigmoid                            colon, in the descending colon, in the transverse                            colon and in the ascending colon, removed with a  cold snare. Resected and retrieved.                           - Diverticulosis in the sigmoid colon.                           - External hemorrhoids.                           - The examination was otherwise normal on direct                            and retroflexion views. Recommendation:           - Patient has a contact number available for                            emergencies. The signs and symptoms of potential                            delayed complications were discussed with the                            patient. Return to normal activities tomorrow.                            Written discharge instructions were provided to the                            patient.                           - Resume previous diet.                           - Continue present medications.                           - Repeat colonoscopy is recommended. The                            colonoscopy date will be determined after pathology                            results from today's exam become available for                            review. Gatha Mayer, MD 06/23/2020 9:19:40 AM This report has been signed electronically.

## 2020-06-28 ENCOUNTER — Telehealth: Payer: Self-pay | Admitting: *Deleted

## 2020-06-28 NOTE — Telephone Encounter (Signed)
  Follow up Call-  Call back number 06/23/2020  Post procedure Call Back phone  # [phone]  Permission to leave phone message Yes  Some recent data might be hidden   LMOM to call back with any questions or concerns.  Also, call back if patient has developed fever, respiratory issues or been dx with COVID or had any family members or close contacts diagnosed since her procedure.

## 2020-06-28 NOTE — Telephone Encounter (Signed)
°  Follow up Call-  Call back number 06/23/2020  Post procedure Call Back phone  # [phone]  Permission to leave phone message Yes  Some recent data might be hidden     Patient questions:  Message left to call us if necessary.

## 2020-07-02 ENCOUNTER — Encounter: Payer: Self-pay | Admitting: Internal Medicine

## 2020-07-02 DIAGNOSIS — Z800 Family history of malignant neoplasm of digestive organs: Secondary | ICD-10-CM | POA: Insufficient documentation

## 2020-07-02 DIAGNOSIS — Z860101 Personal history of adenomatous and serrated colon polyps: Secondary | ICD-10-CM | POA: Insufficient documentation

## 2020-07-02 DIAGNOSIS — Z86010 Personal history of colonic polyps: Secondary | ICD-10-CM

## 2020-09-10 ENCOUNTER — Observation Stay (HOSPITAL_COMMUNITY): Payer: BC Managed Care – PPO

## 2020-09-10 ENCOUNTER — Inpatient Hospital Stay (HOSPITAL_COMMUNITY)
Admission: EM | Admit: 2020-09-10 | Discharge: 2020-09-12 | DRG: 042 | Disposition: A | Discharge status: Home / Self Care | Payer: BC Managed Care – PPO | Attending: Internal Medicine | Admitting: Internal Medicine

## 2020-09-10 ENCOUNTER — Encounter (HOSPITAL_COMMUNITY): Payer: Self-pay | Admitting: Emergency Medicine

## 2020-09-10 ENCOUNTER — Emergency Department (HOSPITAL_COMMUNITY): Payer: BC Managed Care – PPO

## 2020-09-10 ENCOUNTER — Other Ambulatory Visit: Payer: Self-pay

## 2020-09-10 DIAGNOSIS — Z20822 Contact with and (suspected) exposure to covid-19: Secondary | ICD-10-CM | POA: Diagnosis present

## 2020-09-10 DIAGNOSIS — G459 Transient cerebral ischemic attack, unspecified: Secondary | ICD-10-CM | POA: Diagnosis not present

## 2020-09-10 DIAGNOSIS — Z833 Family history of diabetes mellitus: Secondary | ICD-10-CM | POA: Diagnosis not present

## 2020-09-10 DIAGNOSIS — Z800 Family history of malignant neoplasm of digestive organs: Secondary | ICD-10-CM

## 2020-09-10 DIAGNOSIS — Z79899 Other long term (current) drug therapy: Secondary | ICD-10-CM | POA: Diagnosis not present

## 2020-09-10 DIAGNOSIS — Z6832 Body mass index (BMI) 32.0-32.9, adult: Secondary | ICD-10-CM | POA: Diagnosis not present

## 2020-09-10 DIAGNOSIS — G4733 Obstructive sleep apnea (adult) (pediatric): Secondary | ICD-10-CM | POA: Diagnosis not present

## 2020-09-10 DIAGNOSIS — R29700 NIHSS score 0: Secondary | ICD-10-CM | POA: Diagnosis present

## 2020-09-10 DIAGNOSIS — D751 Secondary polycythemia: Secondary | ICD-10-CM | POA: Diagnosis not present

## 2020-09-10 DIAGNOSIS — R4789 Other speech disturbances: Secondary | ICD-10-CM | POA: Diagnosis not present

## 2020-09-10 DIAGNOSIS — K219 Gastro-esophageal reflux disease without esophagitis: Secondary | ICD-10-CM | POA: Diagnosis present

## 2020-09-10 DIAGNOSIS — R4701 Aphasia: Secondary | ICD-10-CM | POA: Diagnosis present

## 2020-09-10 DIAGNOSIS — Z8371 Family history of colonic polyps: Secondary | ICD-10-CM

## 2020-09-10 DIAGNOSIS — I6522 Occlusion and stenosis of left carotid artery: Secondary | ICD-10-CM | POA: Diagnosis present

## 2020-09-10 DIAGNOSIS — F419 Anxiety disorder, unspecified: Secondary | ICD-10-CM | POA: Diagnosis present

## 2020-09-10 DIAGNOSIS — I639 Cerebral infarction, unspecified: Secondary | ICD-10-CM | POA: Diagnosis not present

## 2020-09-10 DIAGNOSIS — I10 Essential (primary) hypertension: Secondary | ICD-10-CM | POA: Diagnosis present

## 2020-09-10 DIAGNOSIS — Z87891 Personal history of nicotine dependence: Secondary | ICD-10-CM | POA: Diagnosis not present

## 2020-09-10 DIAGNOSIS — E785 Hyperlipidemia, unspecified: Secondary | ICD-10-CM | POA: Diagnosis not present

## 2020-09-10 DIAGNOSIS — R29810 Facial weakness: Secondary | ICD-10-CM | POA: Diagnosis not present

## 2020-09-10 DIAGNOSIS — Z8249 Family history of ischemic heart disease and other diseases of the circulatory system: Secondary | ICD-10-CM | POA: Diagnosis not present

## 2020-09-10 LAB — URINALYSIS, ROUTINE W REFLEX MICROSCOPIC
Bacteria, UA: NONE SEEN
Bilirubin Urine: NEGATIVE
Glucose, UA: NEGATIVE mg/dL
Ketones, ur: NEGATIVE mg/dL
Leukocytes,Ua: NEGATIVE
Nitrite: NEGATIVE
Protein, ur: 30 mg/dL — AB
Specific Gravity, Urine: 1.005 (ref 1.005–1.030)
pH: 5 (ref 5.0–8.0)

## 2020-09-10 LAB — RAPID URINE DRUG SCREEN, HOSP PERFORMED
Amphetamines: NOT DETECTED
Barbiturates: NOT DETECTED
Benzodiazepines: NOT DETECTED
Cocaine: NOT DETECTED
Opiates: NOT DETECTED
Tetrahydrocannabinol: NOT DETECTED

## 2020-09-10 LAB — CBC
HCT: 52.4 % — ABNORMAL HIGH (ref 39.0–52.0)
Hemoglobin: 17.9 g/dL — ABNORMAL HIGH (ref 13.0–17.0)
MCH: 31.6 pg (ref 26.0–34.0)
MCHC: 34.2 g/dL (ref 30.0–36.0)
MCV: 92.4 fL (ref 80.0–100.0)
Platelets: 195 10*3/uL (ref 150–400)
RBC: 5.67 MIL/uL (ref 4.22–5.81)
RDW: 12.6 % (ref 11.5–15.5)
WBC: 10.4 10*3/uL (ref 4.0–10.5)
nRBC: 0 % (ref 0.0–0.2)

## 2020-09-10 LAB — CBG MONITORING, ED: Glucose-Capillary: 113 mg/dL — ABNORMAL HIGH (ref 70–99)

## 2020-09-10 LAB — RESP PANEL BY RT-PCR (FLU A&B, COVID) ARPGX2
Influenza A by PCR: NEGATIVE
Influenza B by PCR: NEGATIVE
SARS Coronavirus 2 by RT PCR: NEGATIVE

## 2020-09-10 LAB — COMPREHENSIVE METABOLIC PANEL
ALT: 40 U/L (ref 0–44)
AST: 25 U/L (ref 15–41)
Albumin: 4.5 g/dL (ref 3.5–5.0)
Alkaline Phosphatase: 78 U/L (ref 38–126)
Anion gap: 6 (ref 5–15)
BUN: 29 mg/dL — ABNORMAL HIGH (ref 8–23)
CO2: 29 mmol/L (ref 22–32)
Calcium: 9 mg/dL (ref 8.9–10.3)
Chloride: 103 mmol/L (ref 98–111)
Creatinine, Ser: 1.75 mg/dL — ABNORMAL HIGH (ref 0.61–1.24)
GFR, Estimated: 44 mL/min — ABNORMAL LOW (ref >60)
Glucose, Bld: 111 mg/dL — ABNORMAL HIGH (ref 70–99)
Potassium: 4.3 mmol/L (ref 3.5–5.1)
Sodium: 138 mmol/L (ref 135–145)
Total Bilirubin: 0.7 mg/dL (ref 0.3–1.2)
Total Protein: 8 g/dL (ref 6.5–8.1)

## 2020-09-10 LAB — APTT: aPTT: 28 seconds (ref 24–36)

## 2020-09-10 LAB — ETHANOL: Alcohol, Ethyl (B): <10 mg/dL (ref <10)

## 2020-09-10 MED ORDER — STROKE: EARLY STAGES OF RECOVERY BOOK
Freq: Once | Status: DC
  Filled 2020-09-10: qty 1

## 2020-09-10 MED ORDER — STROKE: EARLY STAGES OF RECOVERY BOOK
Freq: Once | Status: AC
  Filled 2020-09-10: qty 1

## 2020-09-10 MED ORDER — ASPIRIN 300 MG RE SUPP
300.0000 mg | Freq: Every day | RECTAL | Status: DC
  Administered 2020-09-10: 300 mg via RECTAL
  Filled 2020-09-10: qty 1

## 2020-09-10 MED ORDER — ACETAMINOPHEN 650 MG RE SUPP: 650.0000 mg | RECTAL | Status: DC | PRN

## 2020-09-10 MED ORDER — PANTOPRAZOLE SODIUM 40 MG PO TBEC
40.0000 mg | DELAYED_RELEASE_TABLET | Freq: Every day | ORAL | Status: DC
  Administered 2020-09-11 – 2020-09-12 (×2): 40 mg via ORAL
  Filled 2020-09-10 (×2): qty 1

## 2020-09-10 MED ORDER — ACETAMINOPHEN 160 MG/5ML PO SOLN: 650.0000 mg | ORAL | Status: DC | PRN

## 2020-09-10 MED ORDER — ACETAMINOPHEN 325 MG PO TABS: 650.0000 mg | ORAL_TABLET | ORAL | Status: DC | PRN

## 2020-09-10 MED ORDER — SENNOSIDES-DOCUSATE SODIUM 8.6-50 MG PO TABS: 1.0000 | ORAL_TABLET | Freq: Every evening | ORAL | Status: DC | PRN

## 2020-09-10 MED ORDER — LORAZEPAM 1 MG PO TABS: 1.0000 mg | ORAL_TABLET | Freq: Once | ORAL | Status: DC | PRN

## 2020-09-10 MED ORDER — ASPIRIN 325 MG PO TABS
325.0000 mg | ORAL_TABLET | Freq: Every day | ORAL | Status: DC
  Administered 2020-09-11: 325 mg via ORAL
  Filled 2020-09-10: qty 1

## 2020-09-10 NOTE — Consult Note (Signed)
[facility] Telemedicine Consult   Requesting Provider: Cherylann Ratel Consult Participants: Wife, ED nurse, patient, myself Location of the provider: [place], [place]  Location of the [facility]  This consult was provided via telemedicine with 2-way video and audio communication. The patient/family was informed that care would be provided in this way and agreed to receive care in this manner.   Chief Complaint: Transient aphasia and right arm weakness/numbness  HPI: This is a 59 year old gentleman with a past medical history significant for hypertension, hyperlipidemia, obesity, obstructive sleep apnea on CPAP, NASH, GERD, presenting with an acute transient neurological deficit.  He reports that last night at 8:30 PM, arm started feeling numb. Mouth was drooping, speech was unintelligible This lasted 30-60 seconds.  The feeling of numbness did seem to go up his arm briefly in a Jacksonian way.  On review of systems he notes poor sleep due to travel to [place] (works for [employer] at [employer]).  He reports he has been using melatonin for sleep in the past 2 weeks and has increased nicotine intake using nicotine pouches in his gumline in addition to vaping.  He does not have a history of smoking tobacco.  He discussed his symptoms with his brother, a physician, who advised him to take an aspirin and discuss his symptoms with his primary care physician.  He was unable to get a primary care physician appointment urgently, and on calling the health line today was advised to present to to the ED for urgent evaluation  No headache, no vision changes, no hearing changes, no CP/pressure/tightness (some feeling after this episode which he attributes to panic), + GERD (1 month), no SOB, no n/v/d, no fever/chills/weights, + 10 lbs in the last few months, no changes in appetite, no other focal numbness/weakness, + stress at work, no shaking episodes, no seizure history, no head trauma,  no history of meningitis or other CNS infections   Notable he stopped statin due to NASH x 2 years by PCP (he reports AST/ALT elevation, which had improved with diet, then worsened again with worsening diet).   LKW: 8:30 PM on 11/20 tpa given?: No, due to out of the window IR Thrombectomy? No, due to NIHSS to low Modified Rankin Scale: 0-Completely asymptomatic Time of teleneurologist evaluation: 5:45 PM  Exam: Vitals:   09/10/20 1515 09/10/20 1626  BP: (!) 181/92 (!) 152/93  Pulse: 83 83  Resp: (!) 26 (!) 21  Temp: 97.7 F (36.5 C) 97.7 F (36.5 C)  SpO2: 99% 99%   Past Medical History:  Diagnosis Date  . Cataract    "early"  . GERD (gastroesophageal reflux disease)   . Hyperlipidemia    no meds taken  . Hypertension   . Sleep apnea    wears CPA    Current Outpatient Medications  Medication Instructions  . atenolol (TENORMIN) 50 mg, Oral, Daily  . ergocalciferol (VITAMIN D2) 50,000 Units, Oral, Every 7 days  . esomeprazole (NEXIUM) 40 MG capsule 1 tablet, Oral, Daily  . melatonin 6 mg, Oral, At bedtime PRN   Family History  Problem Relation Age of Onset  . Colon cancer Brother 16  . Colon polyps Brother   . Esophageal cancer Neg Hx   . Rectal cancer Neg Hx   . Stomach cancer Neg Hx   Father's side heart disease Mother -- DM and colon cancer  No neuro conditions in his family history that he is aware of including seizures, stroke  Social history  Vapes a  lot but doesn't smoke (Jule) 1 cartridge / day  Nicotine pouches started 2 weeks ago Alcohol 2-3 x year, 1 drink Denies any substance  General: Comfortable, pleasant, affect appropriate, cooperative, respiratory rate is within normal limits, perfusing extremities well based on color.   1A: Level of Consciousness - 0 1B: Ask Month and Age - 0 1C: 'Blink Eyes' & 'Squeeze Hands' - 0 2: Test Horizontal Extraocular Movements - 0 3: Test Visual Fields - 0 4: Test Facial Palsy - 0  5A: Test Left Arm Motor  Drift - 0 5B: Test Right Arm Motor Drift - 0 6A: Test Left Leg Motor Drift - 0 6B: Test Right Leg Motor Drift - 0 7: Test Limb Ataxia - 0  8: Test Sensation - 0 9: Test Language/Aphasia- 0 10: Test Dysarthria - 0 11: Test Extinction/Inattention - 0 NIHSS score: 0  Labs reviewed in epic and pertinent values follow: Cr increased to 1.75 from baseline 1.63 (04/26/20) Hgb elevated at 17.9 (baseline) Moderate Hgb pigment in urine, otherwise negative  HCT personally reviewed, question possible hypodensity, age indeterminate, in the left anterior insular cortex   Assessment: This is a 59 year old gentleman with multiple vascular risk factors as detailed above presenting with a transient neurological deficit for which the differential includes TIA or focal seizure given the sensory spread described which could represent jacksonian march.  On my review there is a possible hypodensity versus artifact in the left insular region as shown in key image above.  At this time his exam as can be performed over televisit is reassuring.  However he needs full work-up for these symptoms given high risk of stroke in the setting of this type of TIA.  Recommendations:   # Transient aphasia and right hand weakness - Stroke labs HgbA1c, fasting lipid panel - MRI brain  - MRA of the brain without contrast and MRA neck w/wo (prefer over CTA given renal function)   - Ativan for MRI anxiety - Frequent neuro checks (q4hr) - Echocardiogram - Carotid dopplers only if MRA neck inadequate due to motion artifact - Prophylactic therapy-Antiplatelet med: Aspirin - dose 325mg  PO or 300mg  PR, followed by 81 mg daily - If no mass lesion on MRI, Plavix 300 mg load with 75 mg daily for 21 - 90 day course  - Risk factor modification - Telemetry monitoring; 30 day event monitor on discharge if no arrythmias captured  - Blood pressure goal   - Normotension - PT consult, OT consult, Speech consult not needed as patient is back  to baseline - Stroke team to follow at [facility]  This patient is receiving care for possible acute neurological changes. There was 50 minutes of care by this provider at the time of service, including time for direct evaluation via telemedicine, review of medical records, imaging studies and discussion of findings with providers, the patient and/or family.  Lesleigh Noe MD-PhD [facility] [phone]

## 2020-09-10 NOTE — H&P (Signed)
History and Physical    Ramaj Frangos NIO:[id number] DOB: 1961/06/13 DOA: 09/10/2020  PCP: Galen Manila, MD  Patient coming from: Home  Chief Complaint: facial droop  HPI: Gabriel Lin is a 59 y.o. male with medical history significant of HTN, HLD, NASH. Presenting with facial droop. Reports that last night around 8:30, he noticed that his right hand was numb. He tried to tell his wife about it, but his speech was slurred. His wife reports that he has some slight facial droop. The patient realized that his words were garbled but he couldn't do anything about it. The episode lasted about a minute. He decided to take an aspirin and go to sleep. Today he was out shopping with is wife when he started feeling dizzy. He also noticed that his tongue felt "weird". He became concerned and started looking up information online. He decided that he needed to come to the ED.    ED Course: CTH was negative for acute findings. Pt's symptoms have largely resolved. EDP spoke with neuro who recommended admission to [facility] for TIA workup. [facility] was called for admisison.   Review of Systems:  Denies CP, palpitations, N/V/D, HA, syncopal episodes, seizure like episodes. Review of systems is otherwise negative for all not mentioned in HPI.   PMHx Past Medical History:  Diagnosis Date  . Cataract    "early"  . GERD (gastroesophageal reflux disease)   . Hyperlipidemia    no meds taken  . Hypertension   . Sleep apnea    wears CPA    PSHx Past Surgical History:  Procedure Laterality Date  . COLONOSCOPY    . HERNIA REPAIR     double hernia repair- 2005 ?  Marland Kitchen LAMINECTOMY      SocHx  reports that he has quit smoking. He has never used smokeless tobacco. He reports previous alcohol use. He reports that he does not use drugs.  No Known Allergies  FamHx Family History  Problem Relation Age of Onset  . Colon cancer Brother 70  . Colon polyps Brother   . Esophageal cancer Neg Hx   . Rectal cancer Neg  Hx   . Stomach cancer Neg Hx     Prior to Admission medications   Medication Sig Start Date End Date Taking? Authorizing Provider  atenolol (TENORMIN) 50 MG tablet Take 50 mg by mouth daily.  10/13/19  Yes [provider]  ergocalciferol (VITAMIN D2) 1.25 MG (50000 UT) capsule Take 50,000 Units by mouth every 7 (seven) days.  07/21/19  Yes [provider]  esomeprazole (NEXIUM) 40 MG capsule Take 1 tablet by mouth daily. 07/21/19  Yes [provider]  melatonin 3 MG TABS tablet Take 6 mg by mouth at bedtime as needed (For sleep).   Yes [provider]    Physical Exam: Vitals:   09/10/20 1515  BP: (!) 181/92  Pulse: 83  Resp: (!) 26  Temp: 97.7 F (36.5 C)  TempSrc: Oral  SpO2: 99%    General: 59 y.o. male resting in bed in NAD Eyes: PERRL, normal sclera ENMT: Nares patent w/o discharge, orophaynx clear, dentition normal, ears w/o discharge/lesions/ulcers Neck: Supple, trachea midline Cardiovascular: RRR, +S1, S2, no m/g/r, equal pulses throughout Respiratory: CTABL, no w/r/r, normal WOB GI: BS+, NDNT, no masses noted, no organomegaly noted MSK: No e/c/c Skin: No rashes, bruises, ulcerations noted Neuro: A&O x 3, no focal deficits Psyc: Appropriate interaction and affect, calm/cooperative  Labs on Admission: I have personally reviewed following labs  and imaging studies  CBC: Recent Labs  Lab 09/10/20 1509  WBC 10.4  HGB 17.9*  HCT 52.4*  MCV 92.4  PLT 390   Basic Metabolic Panel: Recent Labs  Lab 09/10/20 1509  NA 138  K 4.3  CL 103  CO2 29  GLUCOSE 111*  BUN 29*  CREATININE 1.75*  CALCIUM 9.0   GFR: CrCl cannot be calculated (Unknown ideal weight.). Liver Function Tests: Recent Labs  Lab 09/10/20 1509  AST 25  ALT 40  ALKPHOS 78  BILITOT 0.7  PROT 8.0  ALBUMIN 4.5   No results for input(s): LIPASE, AMYLASE in the last 168 hours. No results for input(s): AMMONIA in the last 168 hours. Coagulation  Profile: No results for input(s): INR, PROTIME in the last 168 hours. Cardiac Enzymes: No results for input(s): CKTOTAL, CKMB, CKMBINDEX, TROPONINI in the last 168 hours. BNP (last 3 results) No results for input(s): PROBNP in the last 8760 hours. HbA1C: No results for input(s): HGBA1C in the last 72 hours. CBG: Recent Labs  Lab 09/10/20 1509  GLUCAP 113*   Lipid Profile: No results for input(s): CHOL, HDL, LDLCALC, TRIG, CHOLHDL, LDLDIRECT in the last 72 hours. Thyroid Function Tests: No results for input(s): TSH, T4TOTAL, FREET4, T3FREE, THYROIDAB in the last 72 hours. Anemia Panel: No results for input(s): VITAMINB12, FOLATE, FERRITIN, TIBC, IRON, RETICCTPCT in the last 72 hours. Urine analysis:    Component Value Date/Time   COLORURINE COLORLESS (A) 09/10/2020 1607   APPEARANCEUR CLEAR 09/10/2020 1607   LABSPEC 1.005 09/10/2020 1607   PHURINE 5.0 09/10/2020 1607   GLUCOSEU NEGATIVE 09/10/2020 1607   HGBUR MODERATE (A) 09/10/2020 1607   BILIRUBINUR NEGATIVE 09/10/2020 [place] 09/10/2020 1607   PROTEINUR 30 (A) 09/10/2020 1607   NITRITE NEGATIVE 09/10/2020 1607   LEUKOCYTESUR NEGATIVE 09/10/2020 1607    Radiological Exams on Admission: CT HEAD WO CONTRAST  Result Date: 09/10/2020 CLINICAL DATA:  Right-sided facial droop with headache and numbness beginning last night. Symptoms resolving. EXAM: CT HEAD WITHOUT CONTRAST TECHNIQUE: Contiguous axial images were obtained from the base of the skull through the vertex without intravenous contrast. COMPARISON:  None. FINDINGS: Brain: Ventricles, cisterns and other CSF spaces are normal. There is no mass, mass effect, shift of midline structures or acute hemorrhage. No evidence of acute infarction. Vascular: No hyperdense vessel or unexpected calcification. Skull: Normal. Negative for fracture or focal lesion. Sinuses/Orbits: No acute finding. Other: None. IMPRESSION: No acute findings. Electronically Signed   By:  Marin Olp M.D.   On: 09/10/2020 15:59   Assessment/Plan TIA     - admit to [facility], obs, tele     - MRI Brain, MRA Head/Neck pending     - order carotid dopplers, echo     - PT/OT/SLP eval     - Neuro to see     - he is statin intolerant     - permissive HTN     - ASA  HTN     - hold home atenolol for permissive HTN  GERD     - protonix  DVT prophylaxis: SCDs  Code Status: FULL  Family Communication: With wife at bedside  Consults called: EDP spoke with Neuro   Jonnie Finner DO [facility]  If 7PM-7AM, please contact night-coverage [email or web address]  09/10/2020, 4:26 PM

## 2020-09-10 NOTE — ED Notes (Signed)
[facility] notified of need for transport, reports all trucks out at this time, "will be just a little bit"

## 2020-09-10 NOTE — ED Provider Notes (Signed)
[facility] DEPT Provider Note   CSN: [id number] Arrival date & time: 09/10/20  1449     History Chief Complaint  Patient presents with  . Aphasia  . Headache  . Facial Droop    Gabriel Lin is a 59 y.o. male.  HPI     59 year old comes in a chief complaint of facial droop, slurred speech.  Patient has history of hypertension, hyperlipidemia, sleep apnea.  He reports that last night he had about 30 seconds to a minute episode of slurred speech and he felt that the right upper extremity was weak.  His wife also noted subtle facial droop.  The symptoms have not recurred since.  This morning he took aspirin and called his insurance telehealth option.  They recommended he come to the ER.  Past Medical History:  Diagnosis Date  . Cataract    "early"  . GERD (gastroesophageal reflux disease)   . Hyperlipidemia    no meds taken  . Hypertension   . Sleep apnea    wears CPA    Patient Active Problem List   Diagnosis Date Noted  . TIA (transient ischemic attack) 09/10/2020  . Family history of colon cancer 07/02/2020  . Hx of adenomatous colonic polyps 07/02/2020    Past Surgical History:  Procedure Laterality Date  . COLONOSCOPY    . HERNIA REPAIR     double hernia repair- 2005 ?  Marland Kitchen LAMINECTOMY         Family History  Problem Relation Age of Onset  . Colon cancer Brother 33  . Colon polyps Brother   . Esophageal cancer Neg Hx   . Rectal cancer Neg Hx   . Stomach cancer Neg Hx     Social History   Tobacco Use  . Smoking status: Former Research scientist (life sciences)  . Smokeless tobacco: Never Used  . Tobacco comment: 2012 quit  Vaping Use  . Vaping Use: Some days  Substance Use Topics  . Alcohol use: Not Currently  . Drug use: Never    Home Medications Prior to Admission medications   Medication Sig Start Date End Date Taking? Authorizing Provider  atenolol (TENORMIN) 50 MG tablet Take 50 mg by mouth daily.  10/13/19  Yes [provider]  ergocalciferol (VITAMIN D2) 1.25 MG (50000 UT) capsule Take 50,000 Units by mouth every 7 (seven) days.  07/21/19  Yes [provider]  esomeprazole (NEXIUM) 40 MG capsule Take 1 tablet by mouth daily. 07/21/19  Yes [provider]  melatonin 3 MG TABS tablet Take 6 mg by mouth at bedtime as needed (For sleep).   Yes [provider]    Allergies    Patient has no known allergies.  Review of Systems   Review of Systems  Constitutional: Positive for activity change.  Respiratory: Negative for chest tightness.   Cardiovascular: Negative for chest pain.  Gastrointestinal: Negative for nausea and vomiting.  Musculoskeletal: Negative for back pain.  Neurological: Positive for facial asymmetry and speech difficulty.  All other systems reviewed and are negative.   Physical Exam Updated Vital Signs BP (!) 155/91 (BP Location: Right Arm)   Pulse 72   Temp 97.8 F (36.6 C) (Oral)   Resp 18   SpO2 98%   Physical Exam Vitals and nursing note reviewed.  Constitutional:      Appearance: He is well-developed.  HENT:     Head: Normocephalic and atraumatic.  Eyes:     Pupils: Pupils are equal, round, and reactive  to light.  Neck:     Vascular: No JVD.  Cardiovascular:     Rate and Rhythm: Normal rate and regular rhythm.  Pulmonary:     Effort: Pulmonary effort is normal. No respiratory distress.     Breath sounds: Normal breath sounds. No wheezing.  Abdominal:     General: Bowel sounds are normal. There is no distension.     Palpations: Abdomen is soft.     Tenderness: There is no abdominal tenderness. There is no guarding or rebound.  Musculoskeletal:     Cervical back: Normal range of motion and neck supple.  Skin:    General: Skin is warm and dry.  Neurological:     Mental Status: He is alert and oriented to person, place, and time.     GCS: GCS eye subscore is 4. GCS verbal subscore is 5. GCS motor subscore is 6.     Cranial Nerves: No  cranial nerve deficit.     Coordination: Coordination normal.     Comments: NIHSS - 0 No objective sensory deficits, Motor strength upper and lower extremity 4+ and equal Normal cerebellar exam     ED Results / Procedures / Treatments   Labs (all labs ordered are listed, but only abnormal results are displayed) Labs Reviewed  CBC - Abnormal; Notable for the following components:      Result Value   Hemoglobin 17.9 (*)    HCT 52.4 (*)    All other components within normal limits  COMPREHENSIVE METABOLIC PANEL - Abnormal; Notable for the following components:   Glucose, Bld 111 (*)    BUN 29 (*)    Creatinine, Ser 1.75 (*)    GFR, Estimated 44 (*)    All other components within normal limits  URINALYSIS, ROUTINE W REFLEX MICROSCOPIC - Abnormal; Notable for the following components:   Color, Urine COLORLESS (*)    Hgb urine dipstick MODERATE (*)    Protein, ur 30 (*)    All other components within normal limits  CBG MONITORING, ED - Abnormal; Notable for the following components:   Glucose-Capillary 113 (*)    All other components within normal limits  RESP PANEL BY RT-PCR (FLU A&B, COVID) ARPGX2  RAPID URINE DRUG SCREEN, HOSP PERFORMED  ETHANOL  APTT  HEMOGLOBIN A1C  LIPID PANEL  HIV ANTIBODY (ROUTINE TESTING W REFLEX)  HEMOGLOBIN A1C  LIPID PANEL    EKG None  Radiology CT HEAD WO CONTRAST  Result Date: 09/10/2020 CLINICAL DATA:  Right-sided facial droop with headache and numbness beginning last night. Symptoms resolving. EXAM: CT HEAD WITHOUT CONTRAST TECHNIQUE: Contiguous axial images were obtained from the base of the skull through the vertex without intravenous contrast. COMPARISON:  None. FINDINGS: Brain: Ventricles, cisterns and other CSF spaces are normal. There is no mass, mass effect, shift of midline structures or acute hemorrhage. No evidence of acute infarction. Vascular: No hyperdense vessel or unexpected calcification. Skull: Normal. Negative for fracture  or focal lesion. Sinuses/Orbits: No acute finding. Other: None. IMPRESSION: No acute findings. Electronically Signed   By: Marin Olp M.D.   On: 09/10/2020 15:59    Procedures Procedures (including critical care time)  Medications Ordered in ED Medications  LORazepam (ATIVAN) tablet 1 mg (has no administration in time range)   stroke: mapping our early stages of recovery book (has no administration in time range)  acetaminophen (TYLENOL) tablet 650 mg (has no administration in time range)    Or  acetaminophen (TYLENOL) 160 MG/5ML solution 650  mg (has no administration in time range)    Or  acetaminophen (TYLENOL) suppository 650 mg (has no administration in time range)  senna-docusate (Senokot-S) tablet 1 tablet (has no administration in time range)  pantoprazole (PROTONIX) EC tablet 40 mg (has no administration in time range)   stroke: mapping our early stages of recovery book (has no administration in time range)  aspirin suppository 300 mg (300 mg Rectal Given 09/10/20 2108)    Or  aspirin tablet 325 mg ( Oral See Alternative 09/10/20 2108)    ED Course  I have reviewed the triage vital signs and the nursing notes.  Pertinent labs & imaging results that were available during my care of the patient were reviewed by me and considered in my medical decision making (see chart for details).    MDM Rules/Calculators/A&P                          59 year old male with history of hypertension, hyperlipidemia comes in a chief complaint of right-sided weakness, facial droop and slurred speech.  Symptoms occurred yesterday.  Episode lasted for 30 seconds to 1 minute.  His ABCD score is 3.  I discussed the case with neurology, and they will see the patient at [facility].  They recommend admission.  Patient made aware of the plan.  Final Clinical Impression(s) / ED Diagnoses Final diagnoses:  TIA (transient ischemic attack)    Rx / DC Orders ED Discharge Orders    None        Varney Biles, MD 09/10/20 2322

## 2020-09-10 NOTE — ED Notes (Signed)
CT notified of need for head CT.

## 2020-09-10 NOTE — ED Notes (Signed)
Pt independently ambulatory to bathroom, steady gait.

## 2020-09-10 NOTE — ED Triage Notes (Signed)
Patient here from home reporting right sided facial droop, headache with numbness, and garbled speech that started last night while in bed with wife. Reports that he took an aspirin , saw tele doc today and was sent here. Now ambulatory, talking per norm.

## 2020-09-10 NOTE — ED Notes (Signed)
teleneuro cart setup at bedside.

## 2020-09-10 NOTE — ED Notes (Signed)
Family at bedside. 

## 2020-09-10 NOTE — ED Notes (Signed)
Patient's wife at bedside.

## 2020-09-10 NOTE — ED Notes (Signed)
Teleneuro cart activated

## 2020-09-11 ENCOUNTER — Observation Stay (HOSPITAL_BASED_OUTPATIENT_CLINIC_OR_DEPARTMENT_OTHER): Payer: BC Managed Care – PPO

## 2020-09-11 ENCOUNTER — Observation Stay (HOSPITAL_COMMUNITY): Payer: BC Managed Care – PPO

## 2020-09-11 DIAGNOSIS — G459 Transient cerebral ischemic attack, unspecified: Secondary | ICD-10-CM | POA: Diagnosis not present

## 2020-09-11 DIAGNOSIS — I639 Cerebral infarction, unspecified: Secondary | ICD-10-CM | POA: Diagnosis not present

## 2020-09-11 LAB — ECHOCARDIOGRAM COMPLETE
AR max vel: 3.27 cm2
AV Area VTI: 2.87 cm2
AV Area mean vel: 2.68 cm2
AV Mean grad: 3 mmHg
AV Peak grad: 4.8 mmHg
Ao pk vel: 1.1 m/s
Area-P 1/2: 3.72 cm2
Height: 72 in
S' Lateral: 2.7 cm
Weight: 3840 oz

## 2020-09-11 LAB — HEMOGLOBIN A1C
Hgb A1c MFr Bld: 4.9 % (ref 4.8–5.6)
Hgb A1c MFr Bld: 4.9 % (ref 4.8–5.6)
Mean Plasma Glucose: 93.93 mg/dL
Mean Plasma Glucose: 93.93 mg/dL

## 2020-09-11 LAB — LIPID PANEL
Cholesterol: 217 mg/dL — ABNORMAL HIGH (ref 0–200)
HDL: 41 mg/dL (ref >40)
LDL Cholesterol: 142 mg/dL — ABNORMAL HIGH (ref 0–99)
Total CHOL/HDL Ratio: 5.3 RATIO
Triglycerides: 170 mg/dL — ABNORMAL HIGH (ref <150)
VLDL: 34 mg/dL (ref 0–40)

## 2020-09-11 LAB — BASIC METABOLIC PANEL
Anion gap: 11 (ref 5–15)
BUN: 25 mg/dL — ABNORMAL HIGH (ref 8–23)
CO2: 24 mmol/L (ref 22–32)
Calcium: 9.2 mg/dL (ref 8.9–10.3)
Chloride: 103 mmol/L (ref 98–111)
Creatinine, Ser: 1.8 mg/dL — ABNORMAL HIGH (ref 0.61–1.24)
GFR, Estimated: 42 mL/min — ABNORMAL LOW (ref >60)
Glucose, Bld: 156 mg/dL — ABNORMAL HIGH (ref 70–99)
Potassium: 3.9 mmol/L (ref 3.5–5.1)
Sodium: 138 mmol/L (ref 135–145)

## 2020-09-11 LAB — HIV ANTIBODY (ROUTINE TESTING W REFLEX): HIV Screen 4th Generation wRfx: NONREACTIVE

## 2020-09-11 MED ORDER — CLOPIDOGREL BISULFATE 75 MG PO TABS
75.0000 mg | ORAL_TABLET | Freq: Every day | ORAL | Status: DC
  Administered 2020-09-12: 75 mg via ORAL
  Filled 2020-09-11: qty 1

## 2020-09-11 MED ORDER — LORAZEPAM 2 MG/ML IJ SOLN: 1.0000 mg | Freq: Once | INTRAMUSCULAR | Status: AC | PRN

## 2020-09-11 MED ORDER — CLOPIDOGREL BISULFATE 75 MG PO TABS
300.0000 mg | ORAL_TABLET | Freq: Once | ORAL | Status: AC
  Administered 2020-09-11: 300 mg via ORAL
  Filled 2020-09-11: qty 4

## 2020-09-11 MED ORDER — LORAZEPAM 2 MG/ML IJ SOLN
INTRAMUSCULAR | Status: AC
  Administered 2020-09-11: 1 mg via INTRAVENOUS
  Filled 2020-09-11: qty 1

## 2020-09-11 MED ORDER — ASPIRIN EC 81 MG PO TBEC
81.0000 mg | DELAYED_RELEASE_TABLET | Freq: Every day | ORAL | Status: DC
  Administered 2020-09-12: 81 mg via ORAL
  Filled 2020-09-11: qty 1

## 2020-09-11 MED ORDER — GADOBUTROL 1 MMOL/ML IV SOLN
10.0000 mL | Freq: Once | INTRAVENOUS | Status: AC | PRN
  Administered 2020-09-11: 10 mL via INTRAVENOUS

## 2020-09-11 NOTE — Progress Notes (Signed)
PROGRESS NOTE    Gabriel Lin  FMB:[id number] DOB: 1961/05/07 DOA: 09/10/2020 PCP: Galen Manila, MD   Chief Complaint  Patient presents with  . Aphasia  . Headache  . Facial Droop   Brief Narrative: 59 year old male with history of hypertension, hyperlipidemia, and restless presented with transient speech difficulty, facial droop and arm discomfort.  As per the report on night of 11/20 he felt weird in arm, then could not talk, speech garbled then immediately improved, took aspirin, called pcp next day and called telemed who told him to go to ED. He was seen in the ED, seen by neurology and admitted for stroke work-up.  Subjective: Resting well, no complaints No recurrence of symptoms ambulating well.  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  Further plan with Doppler bilateral lower extremities given recent travel and family strep DVT, carotid Dopplers, TEE in the morning.  Continue aspirin 80 mg daily, Plavix 300 mg x 1 loading today then 75 daily for 3 weeks after that only aspirin alone.  Follow-up with neurology in 6 weeks.  AKI vs progressive CKD stage IIIa: creatinine baseline 1.5 in July 2021, 1.7 on admission, repeat BMP  Hypertension: BP fairly controlled, continue on hold resume on discharge, allow permissive hypertension  NASH history advised to follow-up with PCP  GERD: Continue PPI  OSA on CPAP  Morbid obesity BMI 32.5: Will benefit with weight loss  Nutrition: Diet Order            Diet Heart Room service appropriate? Yes; Fluid consistency: Thin  Diet effective now                  Body mass index is 32.55 kg/m.  DVT prophylaxis: SCD's Start: 09/10/20 2242 Code Status:   Code Status: Full Code  Family  Communication: plan of care discussed with patient at bedside.  Status is: Admitted as observation  Remains hospitalized for ongoing management of ischemic stroke with further work-up including TEE. Dispo: The patient is from: Home              Anticipated d/c is to: Home              Anticipated d/c date is: 1 day              Patient currently is not medically stable to d/c.   Consultants:see note  Procedures:see note  Culture/Microbiology No results found for: SDES, SPECREQUEST, CULT, REPTSTATUS  Other culture-see note  Medications: Scheduled Meds: .  stroke: mapping our early stages of recovery book   Does not apply Once  . [START ON 09/12/2020] aspirin EC  81 mg Oral Daily  . [START ON 09/12/2020] clopidogrel  75 mg Oral Daily  . pantoprazole  40 mg Oral Daily   Continuous Infusions:  Antimicrobials: Anti-infectives (From admission, onward)   None     Objective: Vitals: Today's Vitals   09/11/20 0346 09/11/20 0855 09/11/20 0935 09/11/20 1158  BP: 134/87 (!) 152/81  140/87  Pulse: 74 80  74  Resp: 19 18  18   Temp: 97.9 F (36.6 C) 98.1 F (36.7 C)  97.8 F (36.6 C)  TempSrc: Oral Oral  Oral  SpO2: 100% 98%  97%  Weight:  Height:      PainSc:   0-No pain    No intake or output data in the 24 hours ending 09/11/20 1347 Filed Weights   09/10/20 2244  Weight: 108.9 kg   Weight change:   Intake/Output from previous day: No intake/output data recorded. Intake/Output this shift: No intake/output data recorded.  Examination: General exam: AAOx3 ,NAD, weak appearing. HEENT:Oral mucosa moist, Ear/Nose WNL grossly,dentition normal. Respiratory system: bilaterally clear,no wheezing or crackles,no use of accessory muscle, non tender. Cardiovascular system: S1 & S2 +, regular, No JVD. Gastrointestinal system: Abdomen soft, NT,ND, BS+. Nervous System:Alert, awake, moving extremities and grossly nonfocal Extremities: No edema, distal peripheral pulses  palpable.  Skin: No rashes,no icterus. MSK: Normal muscle bulk,tone, power  Data Reviewed: I have personally reviewed following labs and imaging studies CBC: Recent Labs  Lab 09/10/20 1509  WBC 10.4  HGB 17.9*  HCT 52.4*  MCV 92.4  PLT 622   Basic Metabolic Panel: Recent Labs  Lab 09/10/20 1509  NA 138  K 4.3  CL 103  CO2 29  GLUCOSE 111*  BUN 29*  CREATININE 1.75*  CALCIUM 9.0   GFR: Estimated Creatinine Clearance: 56.5 mL/min (A) (by C-G formula based on SCr of 1.75 mg/dL (H)). Liver Function Tests: Recent Labs  Lab 09/10/20 1509  AST 25  ALT 40  ALKPHOS 78  BILITOT 0.7  PROT 8.0  ALBUMIN 4.5   No results for input(s): LIPASE, AMYLASE in the last 168 hours. No results for input(s): AMMONIA in the last 168 hours. Coagulation Profile: No results for input(s): INR, PROTIME in the last 168 hours. Cardiac Enzymes: No results for input(s): CKTOTAL, CKMB, CKMBINDEX, TROPONINI in the last 168 hours. BNP (last 3 results) No results for input(s): PROBNP in the last 8760 hours. HbA1C: Recent Labs    09/11/20 0140  HGBA1C 4.9   CBG: Recent Labs  Lab 09/10/20 1509  GLUCAP 113*   Lipid Profile: Recent Labs    09/11/20 0140  CHOL 217*  HDL 41  LDLCALC 142*  TRIG 170*  CHOLHDL 5.3   Thyroid Function Tests: No results for input(s): TSH, T4TOTAL, FREET4, T3FREE, THYROIDAB in the last 72 hours. Anemia Panel: No results for input(s): VITAMINB12, FOLATE, FERRITIN, TIBC, IRON, RETICCTPCT in the last 72 hours. Sepsis Labs: No results for input(s): PROCALCITON, LATICACIDVEN in the last 168 hours.  Recent Results (from the past 240 hour(s))  Resp Panel by RT-PCR (Flu A&B, Covid) Nasopharyngeal Swab     Status: None   Collection Time: 09/10/20  4:58 PM   Specimen: Nasopharyngeal Swab; Nasopharyngeal(NP) swabs in vial transport medium  Result Value Ref Range Status   SARS Coronavirus 2 by RT PCR NEGATIVE NEGATIVE Final    Comment: (NOTE) SARS-CoV-2 target  nucleic acids are NOT DETECTED.  The SARS-CoV-2 RNA is generally detectable in upper respiratory specimens during the acute phase of infection. The lowest concentration of SARS-CoV-2 viral copies this assay can detect is 138 copies/mL. A negative result does not preclude SARS-Cov-2 infection and should not be used as the sole basis for treatment or other patient management decisions. A negative result may occur with  improper specimen collection/handling, submission of specimen other than nasopharyngeal swab, presence of viral mutation(s) within the areas targeted by this assay, and inadequate number of viral copies(<138 copies/mL). A negative result must be combined with clinical observations, patient history, and epidemiological information. The expected result is Negative.  Fact Sheet for Patients:  EntrepreneurPulse.com.au  Fact Sheet for Healthcare Providers:  IncredibleEmployment.be  This test is no t yet approved or cleared by the [place] and  has been authorized for detection and/or diagnosis of SARS-CoV-2 by FDA under an Emergency Use Authorization (EUA). This EUA will remain  in effect (meaning this test can be used) for the duration of the COVID-19 declaration under Section 564(b)(1) of the Act, 21 U.S.C.section 360bbb-3(b)(1), unless the authorization is terminated  or revoked sooner.       Influenza A by PCR NEGATIVE NEGATIVE Final   Influenza B by PCR NEGATIVE NEGATIVE Final    Comment: (NOTE) The Xpert Xpress SARS-CoV-2/FLU/RSV plus assay is intended as an aid in the diagnosis of influenza from Nasopharyngeal swab specimens and should not be used as a sole basis for treatment. Nasal washings and aspirates are unacceptable for Xpert Xpress SARS-CoV-2/FLU/RSV testing.  Fact Sheet for Patients: EntrepreneurPulse.com.au  Fact Sheet for Healthcare  Providers: IncredibleEmployment.be  This test is not yet approved or cleared by the [place] FDA and has been authorized for detection and/or diagnosis of SARS-CoV-2 by FDA under an Emergency Use Authorization (EUA). This EUA will remain in effect (meaning this test can be used) for the duration of the COVID-19 declaration under Section 564(b)(1) of the Act, 21 U.S.C. section 360bbb-3(b)(1), unless the authorization is terminated or revoked.  Performed at [facility], [place] [address] [place], [place] [address]      Radiology Studies: CT HEAD WO CONTRAST  Result Date: 09/10/2020 CLINICAL DATA:  Right-sided facial droop with headache and numbness beginning last night. Symptoms resolving. EXAM: CT HEAD WITHOUT CONTRAST TECHNIQUE: Contiguous axial images were obtained from the base of the skull through the vertex without intravenous contrast. COMPARISON:  None. FINDINGS: Brain: Ventricles, cisterns and other CSF spaces are normal. There is no mass, mass effect, shift of midline structures or acute hemorrhage. No evidence of acute infarction. Vascular: No hyperdense vessel or unexpected calcification. Skull: Normal. Negative for fracture or focal lesion. Sinuses/Orbits: No acute finding. Other: None. IMPRESSION: No acute findings. Electronically Signed   By: Marin Olp M.D.   On: 09/10/2020 15:59   MR ANGIO HEAD WO CONTRAST  Result Date: 09/11/2020 CLINICAL DATA:  Initial evaluation for acute facial droop, right hand numbness. EXAM: MRI HEAD WITHOUT CONTRAST MRA HEAD WITHOUT CONTRAST MRA NECK WITHOUT AND WITH CONTRAST TECHNIQUE: Multiplanar, multiecho pulse sequences of the brain and surrounding structures were obtained without intravenous contrast. Angiographic images of the Circle of Willis were obtained using MRA technique without intravenous contrast. Angiographic images of the neck were obtained using MRA technique without and with intravenous  contrast. Carotid stenosis measurements (when applicable) are obtained utilizing NASCET criteria, using the distal internal carotid diameter as the denominator. CONTRAST:  30mL GADAVIST GADOBUTROL 1 MMOL/ML IV SOLN COMPARISON:  Prior CT from 09/10/2020. FINDINGS: MRI HEAD FINDINGS Brain: Cerebral volume within normal limits for age. Few scattered subcentimeter foci of FLAIR hyperintensity noted involving the supratentorial cerebral white matter, nonspecific, but felt to be within normal limits for age. There is a subtle punctate 4 mm focus of diffusion abnormality involving the cortex of the posterior left frontal lobe, left precentral gyrus (series 5, image 87). Given the patient's right-sided symptoms, finding is suspicious for a tiny acute ischemic infarct. Difficult to discern this finding on corresponding ADC map given small size. No associated hemorrhage or mass effect. No other foci of restricted diffusion to suggest acute or subacute ischemia. Gray-white matter differentiation otherwise maintained. No encephalomalacia to suggest chronic cortical infarction elsewhere  within the brain. No foci of susceptibility artifact to suggest acute or chronic intracranial hemorrhage. No mass lesion, midline shift or mass effect. Ventricles normal size without hydrocephalus. No extra-axial fluid collection. Pituitary gland suprasellar region normal. Midline structures intact. Vascular: Major intracranial vascular flow voids are maintained. Skull and upper cervical spine: Craniocervical junction within normal limits. Bone marrow signal intensity normal. No scalp soft tissue abnormality. Sinuses/Orbits: Globes and orbital soft tissues within normal limits. Tiny right maxillary sinus retention cyst noted. Right-to-left nasal septal deviation with associated concha bullosa. Moderate right with small left mastoid effusions. Visualized nasopharynx within normal limits. Inner ear structures normal. Other: None. MRA HEAD FINDINGS  ANTERIOR CIRCULATION: Visualized distal cervical segments of the internal carotid arteries are patent with symmetric antegrade flow. Petrous, cavernous, and supraclinoid segments patent without hemodynamically significant stenosis or other abnormality. A1 segments patent bilaterally. Normal anterior communicating artery complex. Anterior cerebral arteries patent to their distal aspects without stenosis. No M1 stenosis or occlusion. Normal MCA bifurcations. Distal MCA branches well perfused and symmetric. POSTERIOR CIRCULATION: Vertebral artery somewhat diminutive but are patent to the vertebrobasilar junction without stenosis. Right vertebral artery slightly dominant. Both PICA patent. Basilar diminutive but patent to its distal aspect without stenosis. Superior cerebral arteries patent bilaterally. 2-3 mm outpouching at the origin of the right SCA felt to be most consistent with a vascular infundibulum. Fetal type origin of the right PCA. Left PCA supplied via the basilar as well as a robust left posterior communicating artery. Both PCAs well perfused to their distal aspects without stenosis. No intracranial aneurysm. MRA NECK FINDINGS AORTIC ARCH: Visualized aortic arch of normal caliber with normal 3 vessel morphology. No hemodynamically significant stenosis seen about the origin of the great vessels. RIGHT CAROTID SYSTEM: Right CCA patent from its origin to the bifurcation without stenosis. No significant atheromatous narrowing seen about the right bifurcation. Right ICA widely patent distally without stenosis, evidence for dissection or occlusion. LEFT CAROTID SYSTEM: Left CCA patent from its origin to the bifurcation without stenosis. Short-segment atheromatous stenosis of up to approximately 40% seen at the origin of the left ICA (series 1098, image 1). Slight poststenotic dilatation. Left ICA patent distally to the skull base without stenosis, evidence for dissection, or occlusion. VERTEBRAL ARTERIES: Both  vertebral arteries arise from the subclavian arteries. No proximal subclavian artery stenosis. Right vertebral artery dominant with a diffusely hypoplastic left vertebral artery. Both vertebral arteries patent within the neck without stenosis, evidence for dissection or occlusion. IMPRESSION: MRI HEAD IMPRESSION: 1. Subtle punctate 4 mm focus of diffusion abnormality involving the posterior left frontal lobe, left precentral gyrus, suspicious for a tiny acute ischemic infarct. No associated hemorrhage or mass effect. 2. Otherwise normal brain MRI for age. MRA HEAD IMPRESSION: Negative intracranial MRA. No large vessel occlusion, hemodynamically significant stenosis, or other acute vascular abnormality. MRA NECK IMPRESSION: 1. 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 2. Otherwise wide patency of both carotid artery systems within the neck. 3. Wide patency of both vertebral arteries within the neck. Right vertebral artery slightly dominant. Electronically Signed   By: Jeannine Boga M.D.   On: 09/11/2020 02:19   MR ANGIO NECK W WO CONTRAST  Result Date: 09/11/2020 CLINICAL DATA:  Initial evaluation for acute facial droop, right hand numbness. EXAM: MRI HEAD WITHOUT CONTRAST MRA HEAD WITHOUT CONTRAST MRA NECK WITHOUT AND WITH CONTRAST TECHNIQUE: Multiplanar, multiecho pulse sequences of the brain and surrounding structures were obtained without intravenous contrast. Angiographic images of  the Circle of Willis were obtained using MRA technique without intravenous contrast. Angiographic images of the neck were obtained using MRA technique without and with intravenous contrast. Carotid stenosis measurements (when applicable) are obtained utilizing NASCET criteria, using the distal internal carotid diameter as the denominator. CONTRAST:  22mL GADAVIST GADOBUTROL 1 MMOL/ML IV SOLN COMPARISON:  Prior CT from 09/10/2020. FINDINGS: MRI HEAD FINDINGS Brain: Cerebral volume within normal  limits for age. Few scattered subcentimeter foci of FLAIR hyperintensity noted involving the supratentorial cerebral white matter, nonspecific, but felt to be within normal limits for age. There is a subtle punctate 4 mm focus of diffusion abnormality involving the cortex of the posterior left frontal lobe, left precentral gyrus (series 5, image 87). Given the patient's right-sided symptoms, finding is suspicious for a tiny acute ischemic infarct. Difficult to discern this finding on corresponding ADC map given small size. No associated hemorrhage or mass effect. No other foci of restricted diffusion to suggest acute or subacute ischemia. Gray-white matter differentiation otherwise maintained. No encephalomalacia to suggest chronic cortical infarction elsewhere within the brain. No foci of susceptibility artifact to suggest acute or chronic intracranial hemorrhage. No mass lesion, midline shift or mass effect. Ventricles normal size without hydrocephalus. No extra-axial fluid collection. Pituitary gland suprasellar region normal. Midline structures intact. Vascular: Major intracranial vascular flow voids are maintained. Skull and upper cervical spine: Craniocervical junction within normal limits. Bone marrow signal intensity normal. No scalp soft tissue abnormality. Sinuses/Orbits: Globes and orbital soft tissues within normal limits. Tiny right maxillary sinus retention cyst noted. Right-to-left nasal septal deviation with associated concha bullosa. Moderate right with small left mastoid effusions. Visualized nasopharynx within normal limits. Inner ear structures normal. Other: None. MRA HEAD FINDINGS ANTERIOR CIRCULATION: Visualized distal cervical segments of the internal carotid arteries are patent with symmetric antegrade flow. Petrous, cavernous, and supraclinoid segments patent without hemodynamically significant stenosis or other abnormality. A1 segments patent bilaterally. Normal anterior communicating  artery complex. Anterior cerebral arteries patent to their distal aspects without stenosis. No M1 stenosis or occlusion. Normal MCA bifurcations. Distal MCA branches well perfused and symmetric. POSTERIOR CIRCULATION: Vertebral artery somewhat diminutive but are patent to the vertebrobasilar junction without stenosis. Right vertebral artery slightly dominant. Both PICA patent. Basilar diminutive but patent to its distal aspect without stenosis. Superior cerebral arteries patent bilaterally. 2-3 mm outpouching at the origin of the right SCA felt to be most consistent with a vascular infundibulum. Fetal type origin of the right PCA. Left PCA supplied via the basilar as well as a robust left posterior communicating artery. Both PCAs well perfused to their distal aspects without stenosis. No intracranial aneurysm. MRA NECK FINDINGS AORTIC ARCH: Visualized aortic arch of normal caliber with normal 3 vessel morphology. No hemodynamically significant stenosis seen about the origin of the great vessels. RIGHT CAROTID SYSTEM: Right CCA patent from its origin to the bifurcation without stenosis. No significant atheromatous narrowing seen about the right bifurcation. Right ICA widely patent distally without stenosis, evidence for dissection or occlusion. LEFT CAROTID SYSTEM: Left CCA patent from its origin to the bifurcation without stenosis. Short-segment atheromatous stenosis of up to approximately 40% seen at the origin of the left ICA (series 1098, image 1). Slight poststenotic dilatation. Left ICA patent distally to the skull base without stenosis, evidence for dissection, or occlusion. VERTEBRAL ARTERIES: Both vertebral arteries arise from the subclavian arteries. No proximal subclavian artery stenosis. Right vertebral artery dominant with a diffusely hypoplastic left vertebral artery. Both vertebral arteries patent within the neck  without stenosis, evidence for dissection or occlusion. IMPRESSION: MRI HEAD IMPRESSION:  1. Subtle punctate 4 mm focus of diffusion abnormality involving the posterior left frontal lobe, left precentral gyrus, suspicious for a tiny acute ischemic infarct. No associated hemorrhage or mass effect. 2. Otherwise normal brain MRI for age. MRA HEAD IMPRESSION: Negative intracranial MRA. No large vessel occlusion, hemodynamically significant stenosis, or other acute vascular abnormality. MRA NECK IMPRESSION: 1. 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 2. Otherwise wide patency of both carotid artery systems within the neck. 3. Wide patency of both vertebral arteries within the neck. Right vertebral artery slightly dominant. Electronically Signed   By: Jeannine Boga M.D.   On: 09/11/2020 02:19   MR BRAIN WO CONTRAST  Result Date: 09/11/2020 CLINICAL DATA:  Initial evaluation for acute facial droop, right hand numbness. EXAM: MRI HEAD WITHOUT CONTRAST MRA HEAD WITHOUT CONTRAST MRA NECK WITHOUT AND WITH CONTRAST TECHNIQUE: Multiplanar, multiecho pulse sequences of the brain and surrounding structures were obtained without intravenous contrast. Angiographic images of the Circle of Willis were obtained using MRA technique without intravenous contrast. Angiographic images of the neck were obtained using MRA technique without and with intravenous contrast. Carotid stenosis measurements (when applicable) are obtained utilizing NASCET criteria, using the distal internal carotid diameter as the denominator. CONTRAST:  55mL GADAVIST GADOBUTROL 1 MMOL/ML IV SOLN COMPARISON:  Prior CT from 09/10/2020. FINDINGS: MRI HEAD FINDINGS Brain: Cerebral volume within normal limits for age. Few scattered subcentimeter foci of FLAIR hyperintensity noted involving the supratentorial cerebral white matter, nonspecific, but felt to be within normal limits for age. There is a subtle punctate 4 mm focus of diffusion abnormality involving the cortex of the posterior left frontal lobe, left  precentral gyrus (series 5, image 87). Given the patient's right-sided symptoms, finding is suspicious for a tiny acute ischemic infarct. Difficult to discern this finding on corresponding ADC map given small size. No associated hemorrhage or mass effect. No other foci of restricted diffusion to suggest acute or subacute ischemia. Gray-white matter differentiation otherwise maintained. No encephalomalacia to suggest chronic cortical infarction elsewhere within the brain. No foci of susceptibility artifact to suggest acute or chronic intracranial hemorrhage. No mass lesion, midline shift or mass effect. Ventricles normal size without hydrocephalus. No extra-axial fluid collection. Pituitary gland suprasellar region normal. Midline structures intact. Vascular: Major intracranial vascular flow voids are maintained. Skull and upper cervical spine: Craniocervical junction within normal limits. Bone marrow signal intensity normal. No scalp soft tissue abnormality. Sinuses/Orbits: Globes and orbital soft tissues within normal limits. Tiny right maxillary sinus retention cyst noted. Right-to-left nasal septal deviation with associated concha bullosa. Moderate right with small left mastoid effusions. Visualized nasopharynx within normal limits. Inner ear structures normal. Other: None. MRA HEAD FINDINGS ANTERIOR CIRCULATION: Visualized distal cervical segments of the internal carotid arteries are patent with symmetric antegrade flow. Petrous, cavernous, and supraclinoid segments patent without hemodynamically significant stenosis or other abnormality. A1 segments patent bilaterally. Normal anterior communicating artery complex. Anterior cerebral arteries patent to their distal aspects without stenosis. No M1 stenosis or occlusion. Normal MCA bifurcations. Distal MCA branches well perfused and symmetric. POSTERIOR CIRCULATION: Vertebral artery somewhat diminutive but are patent to the vertebrobasilar junction without  stenosis. Right vertebral artery slightly dominant. Both PICA patent. Basilar diminutive but patent to its distal aspect without stenosis. Superior cerebral arteries patent bilaterally. 2-3 mm outpouching at the origin of the right SCA felt to be most consistent with a vascular infundibulum. Fetal type origin  of the right PCA. Left PCA supplied via the basilar as well as a robust left posterior communicating artery. Both PCAs well perfused to their distal aspects without stenosis. No intracranial aneurysm. MRA NECK FINDINGS AORTIC ARCH: Visualized aortic arch of normal caliber with normal 3 vessel morphology. No hemodynamically significant stenosis seen about the origin of the great vessels. RIGHT CAROTID SYSTEM: Right CCA patent from its origin to the bifurcation without stenosis. No significant atheromatous narrowing seen about the right bifurcation. Right ICA widely patent distally without stenosis, evidence for dissection or occlusion. LEFT CAROTID SYSTEM: Left CCA patent from its origin to the bifurcation without stenosis. Short-segment atheromatous stenosis of up to approximately 40% seen at the origin of the left ICA (series 1098, image 1). Slight poststenotic dilatation. Left ICA patent distally to the skull base without stenosis, evidence for dissection, or occlusion. VERTEBRAL ARTERIES: Both vertebral arteries arise from the subclavian arteries. No proximal subclavian artery stenosis. Right vertebral artery dominant with a diffusely hypoplastic left vertebral artery. Both vertebral arteries patent within the neck without stenosis, evidence for dissection or occlusion. IMPRESSION: MRI HEAD IMPRESSION: 1. Subtle punctate 4 mm focus of diffusion abnormality involving the posterior left frontal lobe, left precentral gyrus, suspicious for a tiny acute ischemic infarct. No associated hemorrhage or mass effect. 2. Otherwise normal brain MRI for age. MRA HEAD IMPRESSION: Negative intracranial MRA. No large vessel  occlusion, hemodynamically significant stenosis, or other acute vascular abnormality. MRA NECK IMPRESSION: 1. 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 2. Otherwise wide patency of both carotid artery systems within the neck. 3. Wide patency of both vertebral arteries within the neck. Right vertebral artery slightly dominant. Electronically Signed   By: Jeannine Boga M.D.   On: 09/11/2020 02:19   ECHOCARDIOGRAM COMPLETE  Result Date: 09/11/2020    ECHOCARDIOGRAM REPORT   Patient Name:   CURLEE BOGAN Date of Exam: 09/11/2020 Medical Rec #:  [id number]     Height:       72.0 in Accession #:    [id number]    Weight:       240.0 lb Date of Birth:  1961/06/14    BSA:          2.302 m Patient Age:    23 years      BP:           134/87 mmHg Patient Gender: M             HR:           76 bpm. Exam Location:  Inpatient Procedure: 2D Echo, Cardiac Doppler and Color Doppler Indications:    TIA  History:        Patient has no prior history of Echocardiogram examinations.                 Risk Factors:Sleep Apnea, Hypertension and Dyslipidemia. GERD.  Sonographer:    Clayton Lefort RDCS (AE) Referring Phys: [id number] [place]  1. Left ventricular ejection fraction, by estimation, is 60 to 65%. The left ventricle has normal function. The left ventricle has no regional wall motion abnormalities. There is severe left ventricular hypertrophy. Left ventricular diastolic parameters  are consistent with Grade I diastolic dysfunction (impaired relaxation).  2. Right ventricular systolic function is normal. The right ventricular size is normal. Tricuspid regurgitation signal is inadequate for assessing PA pressure.  3. The mitral valve is grossly normal. Trivial mitral valve regurgitation.  4. The aortic valve is tricuspid.  Aortic valve regurgitation is not visualized.  5. Aortic dilatation noted. There is mild dilatation of the aortic root, measuring 40 mm.  6. The inferior vena cava  is normal in size with greater than 50% respiratory variability, suggesting right atrial pressure of 3 mmHg. Comparison(s): No prior Echocardiogram. FINDINGS  Left Ventricle: Left ventricular ejection fraction, by estimation, is 60 to 65%. The left ventricle has normal function. The left ventricle has no regional wall motion abnormalities. The left ventricular internal cavity size was normal in size. There is  severe left ventricular hypertrophy. Left ventricular diastolic parameters are consistent with Grade I diastolic dysfunction (impaired relaxation). Indeterminate filling pressures. Right Ventricle: The right ventricular size is normal. No increase in right ventricular wall thickness. Right ventricular systolic function is normal. Tricuspid regurgitation signal is inadequate for assessing PA pressure. Left Atrium: Left atrial size was normal in size. Right Atrium: Right atrial size was normal in size. Pericardium: There is no evidence of pericardial effusion. Mitral Valve: The mitral valve is grossly normal. Trivial mitral valve regurgitation. Tricuspid Valve: The tricuspid valve is grossly normal. Tricuspid valve regurgitation is trivial. Aortic Valve: The aortic valve is tricuspid. Aortic valve regurgitation is not visualized. Aortic valve mean gradient measures 3.0 mmHg. Aortic valve peak gradient measures 4.8 mmHg. Aortic valve area, by VTI measures 2.87 cm. Pulmonic Valve: The pulmonic valve was grossly normal. Pulmonic valve regurgitation is not visualized. Aorta: Aortic dilatation noted. There is mild dilatation of the aortic root, measuring 40 mm. Venous: The inferior vena cava is normal in size with greater than 50% respiratory variability, suggesting right atrial pressure of 3 mmHg. IAS/Shunts: No atrial level shunt detected by color flow Doppler.  LEFT VENTRICLE PLAX 2D LVIDd:         3.80 cm  Diastology LVIDs:         2.70 cm  LV e' medial:    9.25 cm/s LV PW:         1.60 cm  LV E/e' medial:  8.4  LV IVS:        1.60 cm  LV e' lateral:   11.10 cm/s LVOT diam:     2.10 cm  LV E/e' lateral: 7.0 LV SV:         69 LV SV Index:   30 LVOT Area:     3.46 cm  RIGHT VENTRICLE          IVC RV Basal diam:  4.50 cm  IVC diam: 1.40 cm RV Mid diam:    3.20 cm TAPSE (M-mode): 2.9 cm LEFT ATRIUM             Index       RIGHT ATRIUM           Index LA diam:        3.60 cm 1.56 cm/m  RA Area:     18.70 cm LA Vol (A2C):   66.8 ml 29.02 ml/m RA Volume:   51.80 ml  22.50 ml/m LA Vol (A4C):   46.9 ml 20.37 ml/m LA Biplane Vol: 55.9 ml 24.28 ml/m  AORTIC VALVE AV Area (Vmax):    3.27 cm AV Area (Vmean):   2.68 cm AV Area (VTI):     2.87 cm AV Vmax:           110.00 cm/s AV Vmean:          79.300 cm/s AV VTI:            0.241 m AV Peak  Grad:      4.8 mmHg AV Mean Grad:      3.0 mmHg LVOT Vmax:         104.00 cm/s LVOT Vmean:        61.400 cm/s LVOT VTI:          0.200 m LVOT/AV VTI ratio: 0.83  AORTA Ao Root diam: 4.00 cm Ao Asc diam:  3.40 cm MITRAL VALVE MV Area (PHT): 3.72 cm    SHUNTS MV Decel Time: 204 msec    Systemic VTI:  0.20 m MV E velocity: 78.00 cm/s  Systemic Diam: 2.10 cm MV A velocity: 63.80 cm/s MV E/A ratio:  1.22 Lyman Bishop MD Electronically signed by Lyman Bishop MD Signature Date/Time: 09/11/2020/9:57:23 AM    Final      LOS: 0 days   Antonieta Pert, MD [facility]  09/11/2020, 1:47 PM

## 2020-09-11 NOTE — Progress Notes (Signed)
Pt stated he can place self on cpap unit when ready for bed.

## 2020-09-11 NOTE — Progress Notes (Signed)
Occupational Therapy Discharge/ screen  Patient Details Name: Gabriel Lin MRN: [id number] DOB: 1961-03-09 Today's Date: 09/11/2020 Time:  -     Patient discharged from OT services secondary to Screened by PT- pt at baseline . OT spoke with PT Shauna and screening placed at this time  Please see latest therapy progress note for current level of functioning and progress toward goals.    Progress and discharge plan discussed with patient and/or caregiver: Patient/Caregiver agrees with plan  GO     Billey Chang, OTR/L  Acute Rehabilitation Services Pager: [phone] Office: [phone] .  09/11/2020, 9:27 AM

## 2020-09-11 NOTE — Progress Notes (Signed)
Lower extremity venous & carotid duplex has been completed.   Preliminary results in CV Proc.   Abram Sander 09/11/2020 2:59 PM

## 2020-09-11 NOTE — Progress Notes (Signed)
    [facility] has been requested to perform a transesophageal echocardiogram on this patient for stroke.  After careful review of history and examination, the risks and benefits of transesophageal echocardiogram have been explained including risks of esophageal damage, perforation (1:10,000 risk), bleeding, pharyngeal hematoma as well as other potential complications associated with anesthesia including aspiration, arrhythmia, respiratory failure and death. Alternatives to treatment were discussed, questions were answered. Patient is willing to proceed. Scheduled for 9am tomorrow with Dr. Harrell Gave. Orders written including NPO after midnight except sips with meds.  Charlie Pitter, PA-C 09/11/2020 3:31 PM

## 2020-09-11 NOTE — Progress Notes (Signed)
  Echocardiogram 2D Echocardiogram has been performed.  Gabriel Lin 09/11/2020, 8:41 AM

## 2020-09-11 NOTE — Evaluation (Signed)
Physical Therapy Evaluation Patient Details Name: Gabriel Lin MRN: [id number] DOB: 1961/01/31 Today's Date: 09/11/2020   History of Present Illness  Patient is a 59 y/o male who presents with transient episode of facial droop, garbled speed and RUE weakness. CT-unremarble. Brain MRI- unremarkable. Workup for TIA. PMH includes HTN, HLD.  Clinical Impression  Patient is independent with ADLs/IADLs and working PTA. Lives with wife and 3 dogs/1 cat. Today, pt tolerated transfers, gait training and stair training mod I-independent with slow gait speed with pt reports as baseline. Scored 23/24 on DGI indicating pt is not a fall risk. Tolerated higher level balance challenges without difficulty. Reports all symptoms have resolved and pt feels back to baseline. Education on [employer]. Pt does not require skilled therapy services as pt functioning at baseline and all education has been completed. Discharge from therapy.    Follow Up Recommendations No PT follow up    Equipment Recommendations  None recommended by PT    Recommendations for Other Services       Precautions / Restrictions Precautions Precautions: None Restrictions Weight Bearing Restrictions: No      Mobility  Bed Mobility Overal bed mobility: Independent             General bed mobility comments: No use of rails, no assist.    Transfers Overall transfer level: Independent Equipment used: None             General transfer comment: Stood from EOB without difficulty.  Ambulation/Gait Ambulation/Gait assistance: Modified independent (Device/Increase time) Gait Distance (Feet): 400 Feet Assistive device: None Gait Pattern/deviations: Wide base of support   Gait velocity interpretation: 1.31 - 2.62 ft/sec, indicative of limited community ambulator General Gait Details: Slow, steady gait with wide BoS. no evidence of imbalance.  Stairs Stairs: Yes Stairs assistance: Modified independent (Device/Increase  time) Stair Management: One rail Right;Alternating pattern Number of Stairs: 5 (2 bouts) General stair comments: Cues for safety. No diffculties.  Wheelchair Mobility    Modified Rankin (Stroke Patients Only) Modified Rankin (Stroke Patients Only) Pre-Morbid Rankin Score: No symptoms Modified Rankin: No symptoms     Balance Overall balance assessment: Needs assistance Sitting-balance support: Feet supported;No upper extremity supported Sitting balance-Leahy Scale: Normal     Standing balance support: During functional activity Standing balance-Leahy Scale: Normal               High level balance activites: Backward walking;Direction changes;Sudden stops;Turns High Level Balance Comments: Tolerated above with no deviations in gait or LOB> Standardized Balance Assessment Standardized Balance Assessment : Dynamic Gait Index   Dynamic Gait Index Level Surface: Normal Change in Gait Speed: Normal Gait with Horizontal Head Turns: Normal Gait with Vertical Head Turns: Normal Gait and Pivot Turn: Normal Step Over Obstacle: Normal Step Around Obstacles: Normal Steps: Mild Impairment Total Score: 23       Pertinent Vitals/Pain Pain Assessment: No/denies pain    Home Living Family/patient expects to be discharged to:: Private residence Living Arrangements: Spouse/significant other Available Help at Discharge: Family;Available PRN/intermittently Type of Home: House Home Access: Stairs to enter Entrance Stairs-Rails: Right Entrance Stairs-Number of Steps: a few Home Layout: Two level Home Equipment: Shower seat - built in;Cane - single point      Prior Function Level of Independence: Independent         Comments: Works for [employer], travels a lot.     Hand Dominance   Dominant Hand: Right    Extremity/Trunk Assessment   Upper Extremity Assessment Upper Extremity Assessment:  Defer to OT evaluation;Overall [facility] for tasks assessed    Lower Extremity  Assessment Lower Extremity Assessment: Overall WFL for tasks assessed    Cervical / Trunk Assessment Cervical / Trunk Assessment: Normal  Communication   Communication: No difficulties  Cognition Arousal/Alertness: Awake/alert Behavior During Therapy: WFL for tasks assessed/performed Overall Cognitive Status: Within Functional Limits for tasks assessed                                        General Comments      Exercises     Assessment/Plan    PT Assessment Patent does not need any further PT services  PT Problem List         PT Treatment Interventions      PT Goals (Current goals can be found in the Care Plan section)  Acute Rehab PT Goals Patient Stated Goal: to go home PT Goal Formulation: All assessment and education complete, DC therapy    Frequency     Barriers to discharge        Co-evaluation               AM-PAC PT "6 Clicks" Mobility  Outcome Measure Help needed turning from your back to your side while in a flat bed without using bedrails?: None Help needed moving from lying on your back to sitting on the side of a flat bed without using bedrails?: None Help needed moving to and from a bed to a chair (including a wheelchair)?: None Help needed standing up from a chair using your arms (e.g., wheelchair or bedside chair)?: None Help needed to walk in hospital room?: None Help needed climbing 3-5 steps with a railing? : None 6 Click Score: 24    End of Session   Activity Tolerance: Patient tolerated treatment well Patient left: Other (comment) (going to bathroom) Nurse Communication: Mobility status PT Visit Diagnosis: Muscle weakness (generalized) (M62.81)    Time: 7829-5621 PT Time Calculation (min) (ACUTE ONLY): 14 min   Charges:   PT Evaluation $PT Eval Low Complexity: 1 Low          Marisa Severin, PT, DPT Acute Rehabilitation Services Pager [phone] Office [phone]      Marguarite Arbour A  [place] 09/11/2020, 8:20 AM

## 2020-09-11 NOTE — Progress Notes (Signed)
SLP Cancellation Note  Patient Details Name: Gabriel Lin MRN: [id number] DOB: [birth year]   Cancelled treatment:       Reason Eval/Treat Not Completed: SLP screened, no needs identified, will sign off   Deasha Clendenin, Katherene Ponto 09/11/2020, 8:44 AM

## 2020-09-11 NOTE — Progress Notes (Addendum)
STROKE TEAM PROGRESS NOTE   INTERVAL HISTORY His wife is at the bedside. 59 year old right handed, obese white male with history of hypertension, hyperlipidemia, obesity, former cigarette smoker who now uses vape,  obstructive sleep apnea on CPAP, NASH, GERD, presenting with an acute transient neurological deficit. He reports a transient episode of right arm numbness and unintelligible speech lasting up to one minute. He took an aspirin and the next day came to the ED after telemedicine consultation. Initial NIHSS 0.   Initial CT head was reported as unremarkable. MRI brain obtained showed a probable ischemic infarct at left frontal lobe. MRA head was unremarkable, but mra neck showed left ICA stenosis at 40%; otherwise unremarkable.     Vitals:   09/10/20 2244 09/11/20 0346 09/11/20 0855 09/11/20 1158  BP: (!) 155/91 134/87 (!) 152/81 140/87  Pulse: 72 74 80 74  Resp: 18 19 18 18   Temp: 97.8 F (36.6 C) 97.9 F (36.6 C) 98.1 F (36.7 C) 97.8 F (36.6 C)  TempSrc: Oral Oral Oral Oral  SpO2: 98% 100% 98% 97%  Weight: 108.9 kg     Height: 6' (1.829 m)      CBC:  Recent Labs  Lab 09/10/20 1509  WBC 10.4  HGB 17.9*  HCT 52.4*  MCV 92.4  PLT 818   Basic Metabolic Panel:  Recent Labs  Lab 09/10/20 1509  NA 138  K 4.3  CL 103  CO2 29  GLUCOSE 111*  BUN 29*  CREATININE 1.75*  CALCIUM 9.0    Lipid Panel:  Recent Labs  Lab 09/11/20 0140  CHOL 217*  TRIG 170*  HDL 41  CHOLHDL 5.3  VLDL 34  LDLCALC 142*    HgbA1c:  Recent Labs  Lab 09/11/20 0140  HGBA1C 4.9   Urine Drug Screen:  Recent Labs  Lab 09/10/20 1607  LABOPIA NONE DETECTED  COCAINSCRNUR NONE DETECTED  LABBENZ NONE DETECTED  AMPHETMU NONE DETECTED  THCU NONE DETECTED  LABBARB NONE DETECTED    Alcohol Level  Recent Labs  Lab 09/10/20 1509  ETH <10    IMAGING past 24 hours CT HEAD WO CONTRAST  Result Date: 09/10/2020 CLINICAL DATA:  Right-sided facial droop with headache and numbness  beginning last night. Symptoms resolving. EXAM: CT HEAD WITHOUT CONTRAST TECHNIQUE: Contiguous axial images were obtained from the base of the skull through the vertex without intravenous contrast. COMPARISON:  None. FINDINGS: Brain: Ventricles, cisterns and other CSF spaces are normal. There is no mass, mass effect, shift of midline structures or acute hemorrhage. No evidence of acute infarction. Vascular: No hyperdense vessel or unexpected calcification. Skull: Normal. Negative for fracture or focal lesion. Sinuses/Orbits: No acute finding. Other: None. IMPRESSION: No acute findings. Electronically Signed   By: Marin Olp M.D.   On: 09/10/2020 15:59   MR ANGIO HEAD WO CONTRAST  Result Date: 09/11/2020 CLINICAL DATA:  Initial evaluation for acute facial droop, right hand numbness. EXAM: MRI HEAD WITHOUT CONTRAST MRA HEAD WITHOUT CONTRAST MRA NECK WITHOUT AND WITH CONTRAST TECHNIQUE: Multiplanar, multiecho pulse sequences of the brain and surrounding structures were obtained without intravenous contrast. Angiographic images of the Circle of Willis were obtained using MRA technique without intravenous contrast. Angiographic images of the neck were obtained using MRA technique without and with intravenous contrast. Carotid stenosis measurements (when applicable) are obtained utilizing NASCET criteria, using the distal internal carotid diameter as the denominator. CONTRAST:  9mL GADAVIST GADOBUTROL 1 MMOL/ML IV SOLN COMPARISON:  Prior CT from 09/10/2020. FINDINGS:  MRI HEAD FINDINGS Brain: Cerebral volume within normal limits for age. Few scattered subcentimeter foci of FLAIR hyperintensity noted involving the supratentorial cerebral white matter, nonspecific, but felt to be within normal limits for age. There is a subtle punctate 4 mm focus of diffusion abnormality involving the cortex of the posterior left frontal lobe, left precentral gyrus (series 5, image 87). Given the patient's right-sided symptoms,  finding is suspicious for a tiny acute ischemic infarct. Difficult to discern this finding on corresponding ADC map given small size. No associated hemorrhage or mass effect. No other foci of restricted diffusion to suggest acute or subacute ischemia. Gray-white matter differentiation otherwise maintained. No encephalomalacia to suggest chronic cortical infarction elsewhere within the brain. No foci of susceptibility artifact to suggest acute or chronic intracranial hemorrhage. No mass lesion, midline shift or mass effect. Ventricles normal size without hydrocephalus. No extra-axial fluid collection. Pituitary gland suprasellar region normal. Midline structures intact. Vascular: Major intracranial vascular flow voids are maintained. Skull and upper cervical spine: Craniocervical junction within normal limits. Bone marrow signal intensity normal. No scalp soft tissue abnormality. Sinuses/Orbits: Globes and orbital soft tissues within normal limits. Tiny right maxillary sinus retention cyst noted. Right-to-left nasal septal deviation with associated concha bullosa. Moderate right with small left mastoid effusions. Visualized nasopharynx within normal limits. Inner ear structures normal. Other: None. MRA HEAD FINDINGS ANTERIOR CIRCULATION: Visualized distal cervical segments of the internal carotid arteries are patent with symmetric antegrade flow. Petrous, cavernous, and supraclinoid segments patent without hemodynamically significant stenosis or other abnormality. A1 segments patent bilaterally. Normal anterior communicating artery complex. Anterior cerebral arteries patent to their distal aspects without stenosis. No M1 stenosis or occlusion. Normal MCA bifurcations. Distal MCA branches well perfused and symmetric. POSTERIOR CIRCULATION: Vertebral artery somewhat diminutive but are patent to the vertebrobasilar junction without stenosis. Right vertebral artery slightly dominant. Both PICA patent. Basilar diminutive  but patent to its distal aspect without stenosis. Superior cerebral arteries patent bilaterally. 2-3 mm outpouching at the origin of the right SCA felt to be most consistent with a vascular infundibulum. Fetal type origin of the right PCA. Left PCA supplied via the basilar as well as a robust left posterior communicating artery. Both PCAs well perfused to their distal aspects without stenosis. No intracranial aneurysm. MRA NECK FINDINGS AORTIC ARCH: Visualized aortic arch of normal caliber with normal 3 vessel morphology. No hemodynamically significant stenosis seen about the origin of the great vessels. RIGHT CAROTID SYSTEM: Right CCA patent from its origin to the bifurcation without stenosis. No significant atheromatous narrowing seen about the right bifurcation. Right ICA widely patent distally without stenosis, evidence for dissection or occlusion. LEFT CAROTID SYSTEM: Left CCA patent from its origin to the bifurcation without stenosis. Short-segment atheromatous stenosis of up to approximately 40% seen at the origin of the left ICA (series 1098, image 1). Slight poststenotic dilatation. Left ICA patent distally to the skull base without stenosis, evidence for dissection, or occlusion. VERTEBRAL ARTERIES: Both vertebral arteries arise from the subclavian arteries. No proximal subclavian artery stenosis. Right vertebral artery dominant with a diffusely hypoplastic left vertebral artery. Both vertebral arteries patent within the neck without stenosis, evidence for dissection or occlusion. IMPRESSION: MRI HEAD IMPRESSION: 1. Subtle punctate 4 mm focus of diffusion abnormality involving the posterior left frontal lobe, left precentral gyrus, suspicious for a tiny acute ischemic infarct. No associated hemorrhage or mass effect. 2. Otherwise normal brain MRI for age. MRA HEAD IMPRESSION: Negative intracranial MRA. No large vessel occlusion, hemodynamically significant stenosis,  or other acute vascular abnormality. MRA  NECK IMPRESSION: 1. 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 2. Otherwise wide patency of both carotid artery systems within the neck. 3. Wide patency of both vertebral arteries within the neck. Right vertebral artery slightly dominant. Electronically Signed   By: Jeannine Boga M.D.   On: 09/11/2020 02:19   MR ANGIO NECK W WO CONTRAST  Result Date: 09/11/2020 CLINICAL DATA:  Initial evaluation for acute facial droop, right hand numbness. EXAM: MRI HEAD WITHOUT CONTRAST MRA HEAD WITHOUT CONTRAST MRA NECK WITHOUT AND WITH CONTRAST TECHNIQUE: Multiplanar, multiecho pulse sequences of the brain and surrounding structures were obtained without intravenous contrast. Angiographic images of the Circle of Willis were obtained using MRA technique without intravenous contrast. Angiographic images of the neck were obtained using MRA technique without and with intravenous contrast. Carotid stenosis measurements (when applicable) are obtained utilizing NASCET criteria, using the distal internal carotid diameter as the denominator. CONTRAST:  99mL GADAVIST GADOBUTROL 1 MMOL/ML IV SOLN COMPARISON:  Prior CT from 09/10/2020. FINDINGS: MRI HEAD FINDINGS Brain: Cerebral volume within normal limits for age. Few scattered subcentimeter foci of FLAIR hyperintensity noted involving the supratentorial cerebral white matter, nonspecific, but felt to be within normal limits for age. There is a subtle punctate 4 mm focus of diffusion abnormality involving the cortex of the posterior left frontal lobe, left precentral gyrus (series 5, image 87). Given the patient's right-sided symptoms, finding is suspicious for a tiny acute ischemic infarct. Difficult to discern this finding on corresponding ADC map given small size. No associated hemorrhage or mass effect. No other foci of restricted diffusion to suggest acute or subacute ischemia. Gray-white matter differentiation otherwise maintained. No  encephalomalacia to suggest chronic cortical infarction elsewhere within the brain. No foci of susceptibility artifact to suggest acute or chronic intracranial hemorrhage. No mass lesion, midline shift or mass effect. Ventricles normal size without hydrocephalus. No extra-axial fluid collection. Pituitary gland suprasellar region normal. Midline structures intact. Vascular: Major intracranial vascular flow voids are maintained. Skull and upper cervical spine: Craniocervical junction within normal limits. Bone marrow signal intensity normal. No scalp soft tissue abnormality. Sinuses/Orbits: Globes and orbital soft tissues within normal limits. Tiny right maxillary sinus retention cyst noted. Right-to-left nasal septal deviation with associated concha bullosa. Moderate right with small left mastoid effusions. Visualized nasopharynx within normal limits. Inner ear structures normal. Other: None. MRA HEAD FINDINGS ANTERIOR CIRCULATION: Visualized distal cervical segments of the internal carotid arteries are patent with symmetric antegrade flow. Petrous, cavernous, and supraclinoid segments patent without hemodynamically significant stenosis or other abnormality. A1 segments patent bilaterally. Normal anterior communicating artery complex. Anterior cerebral arteries patent to their distal aspects without stenosis. No M1 stenosis or occlusion. Normal MCA bifurcations. Distal MCA branches well perfused and symmetric. POSTERIOR CIRCULATION: Vertebral artery somewhat diminutive but are patent to the vertebrobasilar junction without stenosis. Right vertebral artery slightly dominant. Both PICA patent. Basilar diminutive but patent to its distal aspect without stenosis. Superior cerebral arteries patent bilaterally. 2-3 mm outpouching at the origin of the right SCA felt to be most consistent with a vascular infundibulum. Fetal type origin of the right PCA. Left PCA supplied via the basilar as well as a robust left posterior  communicating artery. Both PCAs well perfused to their distal aspects without stenosis. No intracranial aneurysm. MRA NECK FINDINGS AORTIC ARCH: Visualized aortic arch of normal caliber with normal 3 vessel morphology. No hemodynamically significant stenosis seen about the origin of the great vessels.  RIGHT CAROTID SYSTEM: Right CCA patent from its origin to the bifurcation without stenosis. No significant atheromatous narrowing seen about the right bifurcation. Right ICA widely patent distally without stenosis, evidence for dissection or occlusion. LEFT CAROTID SYSTEM: Left CCA patent from its origin to the bifurcation without stenosis. Short-segment atheromatous stenosis of up to approximately 40% seen at the origin of the left ICA (series 1098, image 1). Slight poststenotic dilatation. Left ICA patent distally to the skull base without stenosis, evidence for dissection, or occlusion. VERTEBRAL ARTERIES: Both vertebral arteries arise from the subclavian arteries. No proximal subclavian artery stenosis. Right vertebral artery dominant with a diffusely hypoplastic left vertebral artery. Both vertebral arteries patent within the neck without stenosis, evidence for dissection or occlusion. IMPRESSION: MRI HEAD IMPRESSION: 1. Subtle punctate 4 mm focus of diffusion abnormality involving the posterior left frontal lobe, left precentral gyrus, suspicious for a tiny acute ischemic infarct. No associated hemorrhage or mass effect. 2. Otherwise normal brain MRI for age. MRA HEAD IMPRESSION: Negative intracranial MRA. No large vessel occlusion, hemodynamically significant stenosis, or other acute vascular abnormality. MRA NECK IMPRESSION: 1. 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 2. Otherwise wide patency of both carotid artery systems within the neck. 3. Wide patency of both vertebral arteries within the neck. Right vertebral artery slightly dominant. Electronically Signed   By: Jeannine Boga M.D.   On: 09/11/2020 02:19   MR BRAIN WO CONTRAST  Result Date: 09/11/2020 CLINICAL DATA:  Initial evaluation for acute facial droop, right hand numbness. EXAM: MRI HEAD WITHOUT CONTRAST MRA HEAD WITHOUT CONTRAST MRA NECK WITHOUT AND WITH CONTRAST TECHNIQUE: Multiplanar, multiecho pulse sequences of the brain and surrounding structures were obtained without intravenous contrast. Angiographic images of the Circle of Willis were obtained using MRA technique without intravenous contrast. Angiographic images of the neck were obtained using MRA technique without and with intravenous contrast. Carotid stenosis measurements (when applicable) are obtained utilizing NASCET criteria, using the distal internal carotid diameter as the denominator. CONTRAST:  68mL GADAVIST GADOBUTROL 1 MMOL/ML IV SOLN COMPARISON:  Prior CT from 09/10/2020. FINDINGS: MRI HEAD FINDINGS Brain: Cerebral volume within normal limits for age. Few scattered subcentimeter foci of FLAIR hyperintensity noted involving the supratentorial cerebral white matter, nonspecific, but felt to be within normal limits for age. There is a subtle punctate 4 mm focus of diffusion abnormality involving the cortex of the posterior left frontal lobe, left precentral gyrus (series 5, image 87). Given the patient's right-sided symptoms, finding is suspicious for a tiny acute ischemic infarct. Difficult to discern this finding on corresponding ADC map given small size. No associated hemorrhage or mass effect. No other foci of restricted diffusion to suggest acute or subacute ischemia. Gray-white matter differentiation otherwise maintained. No encephalomalacia to suggest chronic cortical infarction elsewhere within the brain. No foci of susceptibility artifact to suggest acute or chronic intracranial hemorrhage. No mass lesion, midline shift or mass effect. Ventricles normal size without hydrocephalus. No extra-axial fluid collection. Pituitary gland  suprasellar region normal. Midline structures intact. Vascular: Major intracranial vascular flow voids are maintained. Skull and upper cervical spine: Craniocervical junction within normal limits. Bone marrow signal intensity normal. No scalp soft tissue abnormality. Sinuses/Orbits: Globes and orbital soft tissues within normal limits. Tiny right maxillary sinus retention cyst noted. Right-to-left nasal septal deviation with associated concha bullosa. Moderate right with small left mastoid effusions. Visualized nasopharynx within normal limits. Inner ear structures normal. Other: None. MRA HEAD FINDINGS ANTERIOR CIRCULATION: Visualized distal cervical  segments of the internal carotid arteries are patent with symmetric antegrade flow. Petrous, cavernous, and supraclinoid segments patent without hemodynamically significant stenosis or other abnormality. A1 segments patent bilaterally. Normal anterior communicating artery complex. Anterior cerebral arteries patent to their distal aspects without stenosis. No M1 stenosis or occlusion. Normal MCA bifurcations. Distal MCA branches well perfused and symmetric. POSTERIOR CIRCULATION: Vertebral artery somewhat diminutive but are patent to the vertebrobasilar junction without stenosis. Right vertebral artery slightly dominant. Both PICA patent. Basilar diminutive but patent to its distal aspect without stenosis. Superior cerebral arteries patent bilaterally. 2-3 mm outpouching at the origin of the right SCA felt to be most consistent with a vascular infundibulum. Fetal type origin of the right PCA. Left PCA supplied via the basilar as well as a robust left posterior communicating artery. Both PCAs well perfused to their distal aspects without stenosis. No intracranial aneurysm. MRA NECK FINDINGS AORTIC ARCH: Visualized aortic arch of normal caliber with normal 3 vessel morphology. No hemodynamically significant stenosis seen about the origin of the great vessels. RIGHT  CAROTID SYSTEM: Right CCA patent from its origin to the bifurcation without stenosis. No significant atheromatous narrowing seen about the right bifurcation. Right ICA widely patent distally without stenosis, evidence for dissection or occlusion. LEFT CAROTID SYSTEM: Left CCA patent from its origin to the bifurcation without stenosis. Short-segment atheromatous stenosis of up to approximately 40% seen at the origin of the left ICA (series 1098, image 1). Slight poststenotic dilatation. Left ICA patent distally to the skull base without stenosis, evidence for dissection, or occlusion. VERTEBRAL ARTERIES: Both vertebral arteries arise from the subclavian arteries. No proximal subclavian artery stenosis. Right vertebral artery dominant with a diffusely hypoplastic left vertebral artery. Both vertebral arteries patent within the neck without stenosis, evidence for dissection or occlusion. IMPRESSION: MRI HEAD IMPRESSION: 1. Subtle punctate 4 mm focus of diffusion abnormality involving the posterior left frontal lobe, left precentral gyrus, suspicious for a tiny acute ischemic infarct. No associated hemorrhage or mass effect. 2. Otherwise normal brain MRI for age. MRA HEAD IMPRESSION: Negative intracranial MRA. No large vessel occlusion, hemodynamically significant stenosis, or other acute vascular abnormality. MRA NECK IMPRESSION: 1. 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 2. Otherwise wide patency of both carotid artery systems within the neck. 3. Wide patency of both vertebral arteries within the neck. Right vertebral artery slightly dominant. Electronically Signed   By: Jeannine Boga M.D.   On: 09/11/2020 02:19   ECHOCARDIOGRAM COMPLETE  Result Date: 09/11/2020    ECHOCARDIOGRAM REPORT   Patient Name:   JOSELITO FIELDHOUSE Date of Exam: 09/11/2020 Medical Rec #:  [id number]     Height:       72.0 in Accession #:    [id number]    Weight:       240.0 lb Date of Birth:  04-10-1961     BSA:          2.302 m Patient Age:    40 years      BP:           134/87 mmHg Patient Gender: M             HR:           76 bpm. Exam Location:  Inpatient Procedure: 2D Echo, Cardiac Doppler and Color Doppler Indications:    TIA  History:        Patient has no prior history of Echocardiogram examinations.  Risk Factors:Sleep Apnea, Hypertension and Dyslipidemia. GERD.  Sonographer:    Clayton Lefort RDCS (AE) Referring Phys: [id number] [place]  1. Left ventricular ejection fraction, by estimation, is 60 to 65%. The left ventricle has normal function. The left ventricle has no regional wall motion abnormalities. There is severe left ventricular hypertrophy. Left ventricular diastolic parameters  are consistent with Grade I diastolic dysfunction (impaired relaxation).  2. Right ventricular systolic function is normal. The right ventricular size is normal. Tricuspid regurgitation signal is inadequate for assessing PA pressure.  3. The mitral valve is grossly normal. Trivial mitral valve regurgitation.  4. The aortic valve is tricuspid. Aortic valve regurgitation is not visualized.  5. Aortic dilatation noted. There is mild dilatation of the aortic root, measuring 40 mm.  6. The inferior vena cava is normal in size with greater than 50% respiratory variability, suggesting right atrial pressure of 3 mmHg. Comparison(s): No prior Echocardiogram. FINDINGS  Left Ventricle: Left ventricular ejection fraction, by estimation, is 60 to 65%. The left ventricle has normal function. The left ventricle has no regional wall motion abnormalities. The left ventricular internal cavity size was normal in size. There is  severe left ventricular hypertrophy. Left ventricular diastolic parameters are consistent with Grade I diastolic dysfunction (impaired relaxation). Indeterminate filling pressures. Right Ventricle: The right ventricular size is normal. No increase in right ventricular wall thickness. Right  ventricular systolic function is normal. Tricuspid regurgitation signal is inadequate for assessing PA pressure. Left Atrium: Left atrial size was normal in size. Right Atrium: Right atrial size was normal in size. Pericardium: There is no evidence of pericardial effusion. Mitral Valve: The mitral valve is grossly normal. Trivial mitral valve regurgitation. Tricuspid Valve: The tricuspid valve is grossly normal. Tricuspid valve regurgitation is trivial. Aortic Valve: The aortic valve is tricuspid. Aortic valve regurgitation is not visualized. Aortic valve mean gradient measures 3.0 mmHg. Aortic valve peak gradient measures 4.8 mmHg. Aortic valve area, by VTI measures 2.87 cm. Pulmonic Valve: The pulmonic valve was grossly normal. Pulmonic valve regurgitation is not visualized. Aorta: Aortic dilatation noted. There is mild dilatation of the aortic root, measuring 40 mm. Venous: The inferior vena cava is normal in size with greater than 50% respiratory variability, suggesting right atrial pressure of 3 mmHg. IAS/Shunts: No atrial level shunt detected by color flow Doppler.  LEFT VENTRICLE PLAX 2D LVIDd:         3.80 cm  Diastology LVIDs:         2.70 cm  LV e' medial:    9.25 cm/s LV PW:         1.60 cm  LV E/e' medial:  8.4 LV IVS:        1.60 cm  LV e' lateral:   11.10 cm/s LVOT diam:     2.10 cm  LV E/e' lateral: 7.0 LV SV:         69 LV SV Index:   30 LVOT Area:     3.46 cm  RIGHT VENTRICLE          IVC RV Basal diam:  4.50 cm  IVC diam: 1.40 cm RV Mid diam:    3.20 cm TAPSE (M-mode): 2.9 cm LEFT ATRIUM             Index       RIGHT ATRIUM           Index LA diam:        3.60 cm 1.56 cm/m  RA Area:  18.70 cm LA Vol (A2C):   66.8 ml 29.02 ml/m RA Volume:   51.80 ml  22.50 ml/m LA Vol (A4C):   46.9 ml 20.37 ml/m LA Biplane Vol: 55.9 ml 24.28 ml/m  AORTIC VALVE AV Area (Vmax):    3.27 cm AV Area (Vmean):   2.68 cm AV Area (VTI):     2.87 cm AV Vmax:           110.00 cm/s AV Vmean:          79.300 cm/s  AV VTI:            0.241 m AV Peak Grad:      4.8 mmHg AV Mean Grad:      3.0 mmHg LVOT Vmax:         104.00 cm/s LVOT Vmean:        61.400 cm/s LVOT VTI:          0.200 m LVOT/AV VTI ratio: 0.83  AORTA Ao Root diam: 4.00 cm Ao Asc diam:  3.40 cm MITRAL VALVE MV Area (PHT): 3.72 cm    SHUNTS MV Decel Time: 204 msec    Systemic VTI:  0.20 m MV E velocity: 78.00 cm/s  Systemic Diam: 2.10 cm MV A velocity: 63.80 cm/s MV E/A ratio:  1.22 Lyman Bishop MD Electronically signed by Lyman Bishop MD Signature Date/Time: 09/11/2020/9:57:23 AM    Final     PHYSICAL EXAM:  Patient is awake, alert, oriented to person, place, month, year, and situation. Wife presented to room during our assessment.  Patient is able to give a clear and coherent history.  No signs of aphasia or neglect  Cranial Nerves:  II.  Pupils are equal, round, and reactive to light.    III,IV, VI: EOMI without ptosis or diplopia.  V: Facial sensation is symmetric to temperature VII: Facial movement is symmetric.  VIII: hearing is intact to voice X: Palate elevates symmetrically XI: Shoulder shrug is symmetric. XII: tongue is midline without atrophy or fasciculations.  Motor: Bulk is normal.  5/5 strength all extremities.  Sensory: Sensation is symmetric to light touch and temperature in the arms and legs.  Deep Tendon Reflexes: 2+ and symmetric in the biceps and patellae.   Plantars: Toes are downgoing bilaterally.   Cerebellar: FNF and HKS are without ataxia.      ASSESSMENT/PLAN [name] is a 59 y.o. male with history of  Hypertension, dyslipidemia, smoker,  presenting with transient neurological deficit including right arm numbness and speech difficulty, and found by MRI to have a left frontal lobe infarction..   Stroke, left frontal lobe secondary most probably to small vessel disease source   Carotid Doppler  : ordered  Doppler BLE given recent travel and family history of DVT.   TEE ordered   Aspirin  81mg  daily  plavix 300mg  today loading dose then start plavix 75mg  daily on 09/12/20 for 3 weeks, then continue only with aspirin 81mg  daily  Follow up with Dr. Leonie Man six weeks post hospital discharge.   2D Echo :  Left Ventricle: Left ventricular ejection fraction, by estimation, is 60 to 65%. The left ventricle has normal function.   Left Atrium: Left atrial size was normal in size.  IAS/Shunts: No atrial level shunt detected by color flow Doppler.   LDL 142  HgbA1c 4.9  VTE prophylaxis - SCD    Diet   Diet Heart Room service appropriate? Yes; Fluid consistency: Thin     Therapy recommendations:  Discharge  to home without therapy  Disposition:  Home when stable.   Hypertension  Home meds:  Tenormin 50mg  daily  Stable . Permissive hypertension (OK if < 220/120) but gradually normalize in 5-7 days . Long-term BP goal normotensive  Hyperlipidemia  Home meds: non: has history of NASH on statin tehraepy  LDL 142, goal < 70   CBGs Recent Labs    09/10/20 1509  GLUCAP 113*      Other Stroke Risk Factors   Cigarette smoker: advised to stop smoking/ vaping   Obesity, Body mass index is 32.55 kg/m., BMI >/= 30 associated with increased stroke risk, recommend weight loss, diet and exercise as appropriate    Obstructive sleep apnea,  on CPAP at home  Hospital day # 0 He presented with transient 30 second episode of right hand paresthesias and speech difficulties and MRI shows tiny punctate left frontal embolic infarct of cryptogenic etiology.  Recommend check TEE for PFO and cardiac source of embolism and loop recorder for paroxysmal A. fib as well as check lower extremity venous Dopplers for DVT.  Recommend aspirin and Plavix for 3 weeks followed by aspirin alone and aggressive risk factor modification.  Add statin for elevated lipids.  Check carotid ultrasound to follow-up on left ICA stenosis.  Long discussion with patient and wife and answered questions.  Patient  counseled to quit vape.  Patient was considered for possible participation in the BMS exam attic stroke prevention trial but due to his elevated hematocrit he was ruled out.  Discussed with Dr. Antonieta Pert.  Greater than 50% time during the 35-minute visit was spent on counseling and coordination of care about his embolic stroke answering questions and discussion with patient and wife. Antony Contras, MD  To contact Stroke Continuity provider, please refer to [email or web address] After hours, contact General Neurology

## 2020-09-11 NOTE — Progress Notes (Signed)
RT note. RT called to place patient on CPAP for the night, patient was in the process of heading to MRI. CPAP machine all set up and ready to go for when patient returns back to room. Patient instructed on how to turn CPAP/mask on.

## 2020-09-12 ENCOUNTER — Encounter (HOSPITAL_COMMUNITY)
Admission: EM | Disposition: A | Discharge status: Home / Self Care | Payer: Self-pay | Source: Home / Self Care | Attending: Internal Medicine

## 2020-09-12 ENCOUNTER — Observation Stay (HOSPITAL_COMMUNITY): Payer: BC Managed Care – PPO | Admitting: Certified Registered Nurse Anesthetist

## 2020-09-12 ENCOUNTER — Observation Stay (HOSPITAL_COMMUNITY): Payer: BC Managed Care – PPO

## 2020-09-12 ENCOUNTER — Encounter (HOSPITAL_COMMUNITY): Payer: Self-pay | Admitting: Internal Medicine

## 2020-09-12 DIAGNOSIS — Z87891 Personal history of nicotine dependence: Secondary | ICD-10-CM | POA: Diagnosis not present

## 2020-09-12 DIAGNOSIS — R29700 NIHSS score 0: Secondary | ICD-10-CM | POA: Diagnosis present

## 2020-09-12 DIAGNOSIS — G459 Transient cerebral ischemic attack, unspecified: Secondary | ICD-10-CM | POA: Diagnosis not present

## 2020-09-12 DIAGNOSIS — Z8249 Family history of ischemic heart disease and other diseases of the circulatory system: Secondary | ICD-10-CM | POA: Diagnosis not present

## 2020-09-12 DIAGNOSIS — Z20822 Contact with and (suspected) exposure to covid-19: Secondary | ICD-10-CM | POA: Diagnosis present

## 2020-09-12 DIAGNOSIS — G4733 Obstructive sleep apnea (adult) (pediatric): Secondary | ICD-10-CM | POA: Diagnosis present

## 2020-09-12 DIAGNOSIS — Z800 Family history of malignant neoplasm of digestive organs: Secondary | ICD-10-CM | POA: Diagnosis not present

## 2020-09-12 DIAGNOSIS — R479 Unspecified speech disturbances: Secondary | ICD-10-CM | POA: Diagnosis not present

## 2020-09-12 DIAGNOSIS — I6522 Occlusion and stenosis of left carotid artery: Secondary | ICD-10-CM | POA: Diagnosis present

## 2020-09-12 DIAGNOSIS — I639 Cerebral infarction, unspecified: Secondary | ICD-10-CM

## 2020-09-12 DIAGNOSIS — R29810 Facial weakness: Secondary | ICD-10-CM | POA: Diagnosis present

## 2020-09-12 DIAGNOSIS — Z79899 Other long term (current) drug therapy: Secondary | ICD-10-CM | POA: Diagnosis not present

## 2020-09-12 DIAGNOSIS — I313 Pericardial effusion (noninflammatory): Secondary | ICD-10-CM

## 2020-09-12 DIAGNOSIS — Z6832 Body mass index (BMI) 32.0-32.9, adult: Secondary | ICD-10-CM | POA: Diagnosis not present

## 2020-09-12 DIAGNOSIS — E785 Hyperlipidemia, unspecified: Secondary | ICD-10-CM | POA: Diagnosis present

## 2020-09-12 DIAGNOSIS — D751 Secondary polycythemia: Secondary | ICD-10-CM | POA: Diagnosis present

## 2020-09-12 DIAGNOSIS — R4701 Aphasia: Secondary | ICD-10-CM | POA: Diagnosis present

## 2020-09-12 DIAGNOSIS — I10 Essential (primary) hypertension: Secondary | ICD-10-CM | POA: Diagnosis present

## 2020-09-12 DIAGNOSIS — R4789 Other speech disturbances: Secondary | ICD-10-CM | POA: Diagnosis present

## 2020-09-12 DIAGNOSIS — Z833 Family history of diabetes mellitus: Secondary | ICD-10-CM | POA: Diagnosis not present

## 2020-09-12 DIAGNOSIS — F419 Anxiety disorder, unspecified: Secondary | ICD-10-CM | POA: Diagnosis present

## 2020-09-12 DIAGNOSIS — K219 Gastro-esophageal reflux disease without esophagitis: Secondary | ICD-10-CM | POA: Diagnosis present

## 2020-09-12 DIAGNOSIS — Z8371 Family history of colonic polyps: Secondary | ICD-10-CM | POA: Diagnosis not present

## 2020-09-12 HISTORY — DX: Cerebral infarction, unspecified: I63.9

## 2020-09-12 HISTORY — PX: LOOP RECORDER INSERTION: EP1214

## 2020-09-12 HISTORY — PX: TEE WITHOUT CARDIOVERSION: SHX5443

## 2020-09-12 HISTORY — PX: BUBBLE STUDY: SHX6837

## 2020-09-12 LAB — BASIC METABOLIC PANEL
Anion gap: 11 (ref 5–15)
BUN: 28 mg/dL — ABNORMAL HIGH (ref 8–23)
CO2: 22 mmol/L (ref 22–32)
Calcium: 8.9 mg/dL (ref 8.9–10.3)
Chloride: 104 mmol/L (ref 98–111)
Creatinine, Ser: 1.71 mg/dL — ABNORMAL HIGH (ref 0.61–1.24)
GFR, Estimated: 45 mL/min — ABNORMAL LOW (ref >60)
Glucose, Bld: 104 mg/dL — ABNORMAL HIGH (ref 70–99)
Potassium: 4.2 mmol/L (ref 3.5–5.1)
Sodium: 137 mmol/L (ref 135–145)

## 2020-09-12 SURGERY — ECHOCARDIOGRAM, TRANSESOPHAGEAL
Anesthesia: Monitor Anesthesia Care

## 2020-09-12 SURGERY — LOOP RECORDER INSERTION

## 2020-09-12 MED ORDER — PROPOFOL 500 MG/50ML IV EMUL
INTRAVENOUS | Status: DC | PRN
  Administered 2020-09-12: 125 ug/kg/min via INTRAVENOUS

## 2020-09-12 MED ORDER — SODIUM CHLORIDE 0.9 % IV SOLN: INTRAVENOUS | Status: DC

## 2020-09-12 MED ORDER — MELATONIN 3 MG PO TABS: 6.0000 mg | ORAL_TABLET | Freq: Every evening | ORAL | Status: DC | PRN

## 2020-09-12 MED ORDER — PANTOPRAZOLE SODIUM 40 MG PO TBEC: 40.0000 mg | DELAYED_RELEASE_TABLET | Freq: Every day | ORAL | Status: DC

## 2020-09-12 MED ORDER — BUTAMBEN-TETRACAINE-BENZOCAINE 2-2-14 % EX AERO
INHALATION_SPRAY | CUTANEOUS | Status: DC | PRN
  Administered 2020-09-12: 2 via TOPICAL

## 2020-09-12 MED ORDER — LIDOCAINE-EPINEPHRINE 1 %-1:100000 IJ SOLN
INTRAMUSCULAR | Status: AC
  Filled 2020-09-12: qty 1

## 2020-09-12 MED ORDER — ASPIRIN 81 MG PO TBEC: 81.0000 mg | DELAYED_RELEASE_TABLET | Freq: Every day | ORAL | 1 refills | Status: AC

## 2020-09-12 MED ORDER — ATENOLOL 25 MG PO TABS
50.0000 mg | ORAL_TABLET | Freq: Every day | ORAL | Status: DC
  Administered 2020-09-12: 50 mg via ORAL
  Filled 2020-09-12: qty 2

## 2020-09-12 MED ORDER — PROPOFOL 10 MG/ML IV BOLUS
INTRAVENOUS | Status: DC | PRN
  Administered 2020-09-12 (×3): 20 mg via INTRAVENOUS
  Administered 2020-09-12: 30 mg via INTRAVENOUS

## 2020-09-12 MED ORDER — LIDOCAINE-EPINEPHRINE 1 %-1:100000 IJ SOLN
INTRAMUSCULAR | Status: DC | PRN
  Administered 2020-09-12: 10 mL

## 2020-09-12 MED ORDER — CLOPIDOGREL BISULFATE 75 MG PO TABS: 75.0000 mg | ORAL_TABLET | Freq: Every day | ORAL | 0 refills | Status: DC

## 2020-09-12 SURGICAL SUPPLY — 2 items
MONITOR REVEAL LINQ II (Prosthesis & Implant Heart) ×1 IMPLANT
PACK LOOP INSERTION (CUSTOM PROCEDURE TRAY) ×2 IMPLANT

## 2020-09-12 NOTE — Progress Notes (Signed)
PROGRESS NOTE    Gabriel Lin  JOI:[id number] DOB: 16-Nov-1960 DOA: 09/10/2020 PCP: Galen Manila, MD   Chief Complaint  Patient presents with  . Aphasia  . Headache  . Facial Droop   Brief Narrative: 59 year old male with history of hypertension, hyperlipidemia, and restless presented with transient speech difficulty, facial droop and arm discomfort.  As per the report on night of 11/20 he felt weird in arm, then could not talk, speech garbled then immediately improved, took aspirin, called pcp next day and called telemed who told him to go to ED. He was seen in the ED, seen by neurology and admitted for stroke work-up.  Subjective: Resting:No new complaints.  Just came from TEE and has ordered male    Assessment & Plan:  Acute stroke, left frontal lobe secondary to most probably a small vessel disease: Patient underwent extensive stroke work-up with-LDL uncontrolled 142 hemoglobin A1c normal 4.9, 2D echocardiogram with EF 60 to 65%, left atrium normal in size.  Carotid doppler 1-39% stenosis b/l, duplex leg.  Question neurology and underwent TEE no ASD/PFO seen with Doppler but the bubble study is positive for right-to-left shunting consistent with intra-atrial shunt.  Per neurology unclear whether the bubble is coming from given the lack of PFO so getting transcranial Dopplers and may need CT chest with contrast to rule for pulmonary fistula-but his renal function is borderline-we will be hydrating him overnight with IV fluids NSS 125 ml/hr and repeat BMP in  And and discuss w/ neurology loaded with Plavix 300 mg and now on aspirin 81 and Plavix 75 to be continued for 3 weeks then after words aspirin alone.  Statin intolerance hx.  AKI vs progressive CKD stage IIIa: creatinine baseline 1.5 in July 2021, 1.75 on admission , mildly trended up, now on a fluid hydration overnight.  If creatinine does not improve may not be able to get a CT scan w/ contrast. Recent Labs  Lab  09/10/20 1509 09/11/20 1409 09/12/20 0403  BUN 29* 25* 28*  CREATININE 1.75* 1.80* 1.71*   Polycythemia: noticed elevated hemoglobin since 2009 when it was at 18.7 gm- 16-17 gm in oct 2019-here in 17 gm range- We will hydrate and repeat CBC in a.m.He may need hematology evaluation as OP.  Hypertension: Stable we will resume his Tenormin.   NASH history:advised to follow-up with PCP GERD: Continue PPI OSA on CPAP Morbid obesity BMI 32.5: Will benefit with weight loss/pcp follow up.    Diet Order            Diet Heart Room service appropriate? Yes; Fluid consistency: Thin  Diet effective now                Body mass index is 32.55 kg/m. DVT prophylaxis: SCD's Start: 09/10/20 2242 Code Status:   Code Status: Full Code  Family Communication: plan of care discussed with patient at bedside.  Status is: Admitted as observation Remains hospitalized for ongoing management of acute ischemic stroke needing further work-up now with abnormal TEE patient needing further work-up and will need at least 2 midnight stay.   Dispo: The patient is from: Home              Anticipated d/c is to: Home              Anticipated d/c date is: 1 day after completing stroke work-up and if okay with neurology              Patient  currently is not medically stable to d/c.   Consultants:see note  Procedures:see note  Culture/Microbiology No results found for: SDES, SPECREQUEST, CULT, REPTSTATUS  Other culture-see note  Medications: Scheduled Meds: .  stroke: mapping our early stages of recovery book   Does not apply Once  . aspirin EC  81 mg Oral Daily  . clopidogrel  75 mg Oral Daily  . pantoprazole  40 mg Oral Daily   Continuous Infusions: . sodium chloride 125 mL/hr at 09/12/20 1240    Antimicrobials: Anti-infectives (From admission, onward)   None     Objective: Vitals: Today's Vitals   09/12/20 0955 09/12/20 1000 09/12/20 1005 09/12/20 1142  BP: (!) 142/90 (!) 132/108 125/72 (!)  142/83  Pulse: 88 85 85 85  Resp: 17 13 (!) 21 16  Temp:    98 F (36.7 C)  TempSrc:    Oral  SpO2: 97% 96% 95% 99%  Weight:      Height:      PainSc: 0-No pain 0-No pain 0-No pain     Intake/Output Summary (Last 24 hours) at 09/12/2020 1312 Last data filed at 09/12/2020 0939 Gross per 24 hour  Intake 200 ml  Output --  Net 200 ml   Filed Weights   09/10/20 2244  Weight: 108.9 kg   Weight change:   Intake/Output from previous day: No intake/output data recorded. Intake/Output this shift: Total I/O In: 200 [I.V.:200] Out: -   Examination: General exam: AAOx3 , NAD, weak appearing. HEENT:Oral mucosa moist, Ear/Nose WNL grossly, dentition normal. Respiratory system: bilaterally clear, no wheezing or crackles,no use of accessory muscle Cardiovascular system: S1 & S2 +, No JVD,. Gastrointestinal system: Abdomen soft, NT,ND, BS+ Nervous System:Alert, awake, moving extremities and grossly nonfocal Extremities: No edema, distal peripheral pulses palpable.  Skin: No rashes,no icterus. MSK: Normal muscle bulk,tone, power  Data Reviewed: I have personally reviewed following labs and imaging studies CBC: Recent Labs  Lab 09/10/20 1509  WBC 10.4  HGB 17.9*  HCT 52.4*  MCV 92.4  PLT 412   Basic Metabolic Panel: Recent Labs  Lab 09/10/20 1509 09/11/20 1409 09/12/20 0403  NA 138 138 137  K 4.3 3.9 4.2  CL 103 103 104  CO2 29 24 22   GLUCOSE 111* 156* 104*  BUN 29* 25* 28*  CREATININE 1.75* 1.80* 1.71*  CALCIUM 9.0 9.2 8.9   GFR: Estimated Creatinine Clearance: 57.8 mL/min (A) (by C-G formula based on SCr of 1.71 mg/dL (H)). Liver Function Tests: Recent Labs  Lab 09/10/20 1509  AST 25  ALT 40  ALKPHOS 78  BILITOT 0.7  PROT 8.0  ALBUMIN 4.5   No results for input(s): LIPASE, AMYLASE in the last 168 hours. No results for input(s): AMMONIA in the last 168 hours. Coagulation Profile: No results for input(s): INR, PROTIME in the last 168 hours. Cardiac  Enzymes: No results for input(s): CKTOTAL, CKMB, CKMBINDEX, TROPONINI in the last 168 hours. BNP (last 3 results) No results for input(s): PROBNP in the last 8760 hours. HbA1C: Recent Labs    09/11/20 0140 09/11/20 0500  HGBA1C 4.9 4.9   CBG: Recent Labs  Lab 09/10/20 1509  GLUCAP 113*   Lipid Profile: Recent Labs    09/11/20 0140  CHOL 217*  HDL 41  LDLCALC 142*  TRIG 170*  CHOLHDL 5.3   Thyroid Function Tests: No results for input(s): TSH, T4TOTAL, FREET4, T3FREE, THYROIDAB in the last 72 hours. Anemia Panel: No results for input(s): VITAMINB12, FOLATE, FERRITIN, TIBC, IRON, RETICCTPCT  in the last 72 hours. Sepsis Labs: No results for input(s): PROCALCITON, LATICACIDVEN in the last 168 hours.  Recent Results (from the past 240 hour(s))  Resp Panel by RT-PCR (Flu A&B, Covid) Nasopharyngeal Swab     Status: None   Collection Time: 09/10/20  4:58 PM   Specimen: Nasopharyngeal Swab; Nasopharyngeal(NP) swabs in vial transport medium  Result Value Ref Range Status   SARS Coronavirus 2 by RT PCR NEGATIVE NEGATIVE Final    Comment: (NOTE) SARS-CoV-2 target nucleic acids are NOT DETECTED.  The SARS-CoV-2 RNA is generally detectable in upper respiratory specimens during the acute phase of infection. The lowest concentration of SARS-CoV-2 viral copies this assay can detect is 138 copies/mL. A negative result does not preclude SARS-Cov-2 infection and should not be used as the sole basis for treatment or other patient management decisions. A negative result may occur with  improper specimen collection/handling, submission of specimen other than nasopharyngeal swab, presence of viral mutation(s) within the areas targeted by this assay, and inadequate number of viral copies(<138 copies/mL). A negative result must be combined with clinical observations, patient history, and epidemiological information. The expected result is Negative.  Fact Sheet for Patients:   EntrepreneurPulse.com.au  Fact Sheet for Healthcare Providers:  IncredibleEmployment.be  This test is no t yet approved or cleared by the [place] FDA and  has been authorized for detection and/or diagnosis of SARS-CoV-2 by FDA under an Emergency Use Authorization (EUA). This EUA will remain  in effect (meaning this test can be used) for the duration of the COVID-19 declaration under Section 564(b)(1) of the Act, 21 U.S.C.section 360bbb-3(b)(1), unless the authorization is terminated  or revoked sooner.       Influenza A by PCR NEGATIVE NEGATIVE Final   Influenza B by PCR NEGATIVE NEGATIVE Final    Comment: (NOTE) The Xpert Xpress SARS-CoV-2/FLU/RSV plus assay is intended as an aid in the diagnosis of influenza from Nasopharyngeal swab specimens and should not be used as a sole basis for treatment. Nasal washings and aspirates are unacceptable for Xpert Xpress SARS-CoV-2/FLU/RSV testing.  Fact Sheet for Patients: EntrepreneurPulse.com.au  Fact Sheet for Healthcare Providers: IncredibleEmployment.be  This test is not yet approved or cleared by the [place] FDA and has been authorized for detection and/or diagnosis of SARS-CoV-2 by FDA under an Emergency Use Authorization (EUA). This EUA will remain in effect (meaning this test can be used) for the duration of the COVID-19 declaration under Section 564(b)(1) of the Act, 21 U.S.C. section 360bbb-3(b)(1), unless the authorization is terminated or revoked.  Performed at [facility], [place] [address], [place], [place] [address]      Radiology Studies: CT HEAD WO CONTRAST  Result Date: 09/10/2020 CLINICAL DATA:  Right-sided facial droop with headache and numbness beginning last night. Symptoms resolving. EXAM: CT HEAD WITHOUT CONTRAST TECHNIQUE: Contiguous axial images were obtained from the base of the skull through the vertex  without intravenous contrast. COMPARISON:  None. FINDINGS: Brain: Ventricles, cisterns and other CSF spaces are normal. There is no mass, mass effect, shift of midline structures or acute hemorrhage. No evidence of acute infarction. Vascular: No hyperdense vessel or unexpected calcification. Skull: Normal. Negative for fracture or focal lesion. Sinuses/Orbits: No acute finding. Other: None. IMPRESSION: No acute findings. Electronically Signed   By: Marin Olp M.D.   On: 09/10/2020 15:59   MR ANGIO HEAD WO CONTRAST  Result Date: 09/11/2020 CLINICAL DATA:  Initial evaluation for acute facial droop, right hand numbness. EXAM: MRI  HEAD WITHOUT CONTRAST MRA HEAD WITHOUT CONTRAST MRA NECK WITHOUT AND WITH CONTRAST TECHNIQUE: Multiplanar, multiecho pulse sequences of the brain and surrounding structures were obtained without intravenous contrast. Angiographic images of the Circle of Willis were obtained using MRA technique without intravenous contrast. Angiographic images of the neck were obtained using MRA technique without and with intravenous contrast. Carotid stenosis measurements (when applicable) are obtained utilizing NASCET criteria, using the distal internal carotid diameter as the denominator. CONTRAST:  39mL GADAVIST GADOBUTROL 1 MMOL/ML IV SOLN COMPARISON:  Prior CT from 09/10/2020. FINDINGS: MRI HEAD FINDINGS Brain: Cerebral volume within normal limits for age. Few scattered subcentimeter foci of FLAIR hyperintensity noted involving the supratentorial cerebral white matter, nonspecific, but felt to be within normal limits for age. There is a subtle punctate 4 mm focus of diffusion abnormality involving the cortex of the posterior left frontal lobe, left precentral gyrus (series 5, image 87). Given the patient's right-sided symptoms, finding is suspicious for a tiny acute ischemic infarct. Difficult to discern this finding on corresponding ADC map given small size. No associated hemorrhage or mass  effect. No other foci of restricted diffusion to suggest acute or subacute ischemia. Gray-white matter differentiation otherwise maintained. No encephalomalacia to suggest chronic cortical infarction elsewhere within the brain. No foci of susceptibility artifact to suggest acute or chronic intracranial hemorrhage. No mass lesion, midline shift or mass effect. Ventricles normal size without hydrocephalus. No extra-axial fluid collection. Pituitary gland suprasellar region normal. Midline structures intact. Vascular: Major intracranial vascular flow voids are maintained. Skull and upper cervical spine: Craniocervical junction within normal limits. Bone marrow signal intensity normal. No scalp soft tissue abnormality. Sinuses/Orbits: Globes and orbital soft tissues within normal limits. Tiny right maxillary sinus retention cyst noted. Right-to-left nasal septal deviation with associated concha bullosa. Moderate right with small left mastoid effusions. Visualized nasopharynx within normal limits. Inner ear structures normal. Other: None. MRA HEAD FINDINGS ANTERIOR CIRCULATION: Visualized distal cervical segments of the internal carotid arteries are patent with symmetric antegrade flow. Petrous, cavernous, and supraclinoid segments patent without hemodynamically significant stenosis or other abnormality. A1 segments patent bilaterally. Normal anterior communicating artery complex. Anterior cerebral arteries patent to their distal aspects without stenosis. No M1 stenosis or occlusion. Normal MCA bifurcations. Distal MCA branches well perfused and symmetric. POSTERIOR CIRCULATION: Vertebral artery somewhat diminutive but are patent to the vertebrobasilar junction without stenosis. Right vertebral artery slightly dominant. Both PICA patent. Basilar diminutive but patent to its distal aspect without stenosis. Superior cerebral arteries patent bilaterally. 2-3 mm outpouching at the origin of the right SCA felt to be most  consistent with a vascular infundibulum. Fetal type origin of the right PCA. Left PCA supplied via the basilar as well as a robust left posterior communicating artery. Both PCAs well perfused to their distal aspects without stenosis. No intracranial aneurysm. MRA NECK FINDINGS AORTIC ARCH: Visualized aortic arch of normal caliber with normal 3 vessel morphology. No hemodynamically significant stenosis seen about the origin of the great vessels. RIGHT CAROTID SYSTEM: Right CCA patent from its origin to the bifurcation without stenosis. No significant atheromatous narrowing seen about the right bifurcation. Right ICA widely patent distally without stenosis, evidence for dissection or occlusion. LEFT CAROTID SYSTEM: Left CCA patent from its origin to the bifurcation without stenosis. Short-segment atheromatous stenosis of up to approximately 40% seen at the origin of the left ICA (series 1098, image 1). Slight poststenotic dilatation. Left ICA patent distally to the skull base without stenosis, evidence for dissection, or occlusion. VERTEBRAL  ARTERIES: Both vertebral arteries arise from the subclavian arteries. No proximal subclavian artery stenosis. Right vertebral artery dominant with a diffusely hypoplastic left vertebral artery. Both vertebral arteries patent within the neck without stenosis, evidence for dissection or occlusion. IMPRESSION: MRI HEAD IMPRESSION: 1. Subtle punctate 4 mm focus of diffusion abnormality involving the posterior left frontal lobe, left precentral gyrus, suspicious for a tiny acute ischemic infarct. No associated hemorrhage or mass effect. 2. Otherwise normal brain MRI for age. MRA HEAD IMPRESSION: Negative intracranial MRA. No large vessel occlusion, hemodynamically significant stenosis, or other acute vascular abnormality. MRA NECK IMPRESSION: 1. 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 2. Otherwise wide patency of both carotid artery systems within the  neck. 3. Wide patency of both vertebral arteries within the neck. Right vertebral artery slightly dominant. Electronically Signed   By: Jeannine Boga M.D.   On: 09/11/2020 02:19   MR ANGIO NECK W WO CONTRAST  Result Date: 09/11/2020 CLINICAL DATA:  Initial evaluation for acute facial droop, right hand numbness. EXAM: MRI HEAD WITHOUT CONTRAST MRA HEAD WITHOUT CONTRAST MRA NECK WITHOUT AND WITH CONTRAST TECHNIQUE: Multiplanar, multiecho pulse sequences of the brain and surrounding structures were obtained without intravenous contrast. Angiographic images of the Circle of Willis were obtained using MRA technique without intravenous contrast. Angiographic images of the neck were obtained using MRA technique without and with intravenous contrast. Carotid stenosis measurements (when applicable) are obtained utilizing NASCET criteria, using the distal internal carotid diameter as the denominator. CONTRAST:  57mL GADAVIST GADOBUTROL 1 MMOL/ML IV SOLN COMPARISON:  Prior CT from 09/10/2020. FINDINGS: MRI HEAD FINDINGS Brain: Cerebral volume within normal limits for age. Few scattered subcentimeter foci of FLAIR hyperintensity noted involving the supratentorial cerebral white matter, nonspecific, but felt to be within normal limits for age. There is a subtle punctate 4 mm focus of diffusion abnormality involving the cortex of the posterior left frontal lobe, left precentral gyrus (series 5, image 87). Given the patient's right-sided symptoms, finding is suspicious for a tiny acute ischemic infarct. Difficult to discern this finding on corresponding ADC map given small size. No associated hemorrhage or mass effect. No other foci of restricted diffusion to suggest acute or subacute ischemia. Gray-white matter differentiation otherwise maintained. No encephalomalacia to suggest chronic cortical infarction elsewhere within the brain. No foci of susceptibility artifact to suggest acute or chronic intracranial  hemorrhage. No mass lesion, midline shift or mass effect. Ventricles normal size without hydrocephalus. No extra-axial fluid collection. Pituitary gland suprasellar region normal. Midline structures intact. Vascular: Major intracranial vascular flow voids are maintained. Skull and upper cervical spine: Craniocervical junction within normal limits. Bone marrow signal intensity normal. No scalp soft tissue abnormality. Sinuses/Orbits: Globes and orbital soft tissues within normal limits. Tiny right maxillary sinus retention cyst noted. Right-to-left nasal septal deviation with associated concha bullosa. Moderate right with small left mastoid effusions. Visualized nasopharynx within normal limits. Inner ear structures normal. Other: None. MRA HEAD FINDINGS ANTERIOR CIRCULATION: Visualized distal cervical segments of the internal carotid arteries are patent with symmetric antegrade flow. Petrous, cavernous, and supraclinoid segments patent without hemodynamically significant stenosis or other abnormality. A1 segments patent bilaterally. Normal anterior communicating artery complex. Anterior cerebral arteries patent to their distal aspects without stenosis. No M1 stenosis or occlusion. Normal MCA bifurcations. Distal MCA branches well perfused and symmetric. POSTERIOR CIRCULATION: Vertebral artery somewhat diminutive but are patent to the vertebrobasilar junction without stenosis. Right vertebral artery slightly dominant. Both PICA patent. Basilar diminutive but patent  to its distal aspect without stenosis. Superior cerebral arteries patent bilaterally. 2-3 mm outpouching at the origin of the right SCA felt to be most consistent with a vascular infundibulum. Fetal type origin of the right PCA. Left PCA supplied via the basilar as well as a robust left posterior communicating artery. Both PCAs well perfused to their distal aspects without stenosis. No intracranial aneurysm. MRA NECK FINDINGS AORTIC ARCH: Visualized aortic  arch of normal caliber with normal 3 vessel morphology. No hemodynamically significant stenosis seen about the origin of the great vessels. RIGHT CAROTID SYSTEM: Right CCA patent from its origin to the bifurcation without stenosis. No significant atheromatous narrowing seen about the right bifurcation. Right ICA widely patent distally without stenosis, evidence for dissection or occlusion. LEFT CAROTID SYSTEM: Left CCA patent from its origin to the bifurcation without stenosis. Short-segment atheromatous stenosis of up to approximately 40% seen at the origin of the left ICA (series 1098, image 1). Slight poststenotic dilatation. Left ICA patent distally to the skull base without stenosis, evidence for dissection, or occlusion. VERTEBRAL ARTERIES: Both vertebral arteries arise from the subclavian arteries. No proximal subclavian artery stenosis. Right vertebral artery dominant with a diffusely hypoplastic left vertebral artery. Both vertebral arteries patent within the neck without stenosis, evidence for dissection or occlusion. IMPRESSION: MRI HEAD IMPRESSION: 1. Subtle punctate 4 mm focus of diffusion abnormality involving the posterior left frontal lobe, left precentral gyrus, suspicious for a tiny acute ischemic infarct. No associated hemorrhage or mass effect. 2. Otherwise normal brain MRI for age. MRA HEAD IMPRESSION: Negative intracranial MRA. No large vessel occlusion, hemodynamically significant stenosis, or other acute vascular abnormality. MRA NECK IMPRESSION: 1. 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 2. Otherwise wide patency of both carotid artery systems within the neck. 3. Wide patency of both vertebral arteries within the neck. Right vertebral artery slightly dominant. Electronically Signed   By: Jeannine Boga M.D.   On: 09/11/2020 02:19   MR BRAIN WO CONTRAST  Result Date: 09/11/2020 CLINICAL DATA:  Initial evaluation for acute facial droop, right hand  numbness. EXAM: MRI HEAD WITHOUT CONTRAST MRA HEAD WITHOUT CONTRAST MRA NECK WITHOUT AND WITH CONTRAST TECHNIQUE: Multiplanar, multiecho pulse sequences of the brain and surrounding structures were obtained without intravenous contrast. Angiographic images of the Circle of Willis were obtained using MRA technique without intravenous contrast. Angiographic images of the neck were obtained using MRA technique without and with intravenous contrast. Carotid stenosis measurements (when applicable) are obtained utilizing NASCET criteria, using the distal internal carotid diameter as the denominator. CONTRAST:  7mL GADAVIST GADOBUTROL 1 MMOL/ML IV SOLN COMPARISON:  Prior CT from 09/10/2020. FINDINGS: MRI HEAD FINDINGS Brain: Cerebral volume within normal limits for age. Few scattered subcentimeter foci of FLAIR hyperintensity noted involving the supratentorial cerebral white matter, nonspecific, but felt to be within normal limits for age. There is a subtle punctate 4 mm focus of diffusion abnormality involving the cortex of the posterior left frontal lobe, left precentral gyrus (series 5, image 87). Given the patient's right-sided symptoms, finding is suspicious for a tiny acute ischemic infarct. Difficult to discern this finding on corresponding ADC map given small size. No associated hemorrhage or mass effect. No other foci of restricted diffusion to suggest acute or subacute ischemia. Gray-white matter differentiation otherwise maintained. No encephalomalacia to suggest chronic cortical infarction elsewhere within the brain. No foci of susceptibility artifact to suggest acute or chronic intracranial hemorrhage. No mass lesion, midline shift or mass effect. Ventricles normal  size without hydrocephalus. No extra-axial fluid collection. Pituitary gland suprasellar region normal. Midline structures intact. Vascular: Major intracranial vascular flow voids are maintained. Skull and upper cervical spine: Craniocervical  junction within normal limits. Bone marrow signal intensity normal. No scalp soft tissue abnormality. Sinuses/Orbits: Globes and orbital soft tissues within normal limits. Tiny right maxillary sinus retention cyst noted. Right-to-left nasal septal deviation with associated concha bullosa. Moderate right with small left mastoid effusions. Visualized nasopharynx within normal limits. Inner ear structures normal. Other: None. MRA HEAD FINDINGS ANTERIOR CIRCULATION: Visualized distal cervical segments of the internal carotid arteries are patent with symmetric antegrade flow. Petrous, cavernous, and supraclinoid segments patent without hemodynamically significant stenosis or other abnormality. A1 segments patent bilaterally. Normal anterior communicating artery complex. Anterior cerebral arteries patent to their distal aspects without stenosis. No M1 stenosis or occlusion. Normal MCA bifurcations. Distal MCA branches well perfused and symmetric. POSTERIOR CIRCULATION: Vertebral artery somewhat diminutive but are patent to the vertebrobasilar junction without stenosis. Right vertebral artery slightly dominant. Both PICA patent. Basilar diminutive but patent to its distal aspect without stenosis. Superior cerebral arteries patent bilaterally. 2-3 mm outpouching at the origin of the right SCA felt to be most consistent with a vascular infundibulum. Fetal type origin of the right PCA. Left PCA supplied via the basilar as well as a robust left posterior communicating artery. Both PCAs well perfused to their distal aspects without stenosis. No intracranial aneurysm. MRA NECK FINDINGS AORTIC ARCH: Visualized aortic arch of normal caliber with normal 3 vessel morphology. No hemodynamically significant stenosis seen about the origin of the great vessels. RIGHT CAROTID SYSTEM: Right CCA patent from its origin to the bifurcation without stenosis. No significant atheromatous narrowing seen about the right bifurcation. Right ICA  widely patent distally without stenosis, evidence for dissection or occlusion. LEFT CAROTID SYSTEM: Left CCA patent from its origin to the bifurcation without stenosis. Short-segment atheromatous stenosis of up to approximately 40% seen at the origin of the left ICA (series 1098, image 1). Slight poststenotic dilatation. Left ICA patent distally to the skull base without stenosis, evidence for dissection, or occlusion. VERTEBRAL ARTERIES: Both vertebral arteries arise from the subclavian arteries. No proximal subclavian artery stenosis. Right vertebral artery dominant with a diffusely hypoplastic left vertebral artery. Both vertebral arteries patent within the neck without stenosis, evidence for dissection or occlusion. IMPRESSION: MRI HEAD IMPRESSION: 1. Subtle punctate 4 mm focus of diffusion abnormality involving the posterior left frontal lobe, left precentral gyrus, suspicious for a tiny acute ischemic infarct. No associated hemorrhage or mass effect. 2. Otherwise normal brain MRI for age. MRA HEAD IMPRESSION: Negative intracranial MRA. No large vessel occlusion, hemodynamically significant stenosis, or other acute vascular abnormality. MRA NECK IMPRESSION: 1. 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 2. Otherwise wide patency of both carotid artery systems within the neck. 3. Wide patency of both vertebral arteries within the neck. Right vertebral artery slightly dominant. Electronically Signed   By: Jeannine Boga M.D.   On: 09/11/2020 02:19   ECHOCARDIOGRAM COMPLETE  Result Date: 09/11/2020    ECHOCARDIOGRAM REPORT   Patient Name:   Gabriel Lin Date of Exam: 09/11/2020 Medical Rec #:  [id number]     Height:       72.0 in Accession #:    [id number]    Weight:       240.0 lb Date of Birth:  1961/05/28    BSA:          2.302 m Patient Age:  59 years      BP:           134/87 mmHg Patient Gender: M             HR:           76 bpm. Exam Location:  Inpatient Procedure:  2D Echo, Cardiac Doppler and Color Doppler Indications:    TIA  History:        Patient has no prior history of Echocardiogram examinations.                 Risk Factors:Sleep Apnea, Hypertension and Dyslipidemia. GERD.  Sonographer:    Clayton Lefort RDCS (AE) Referring Phys: [id number] [place]  1. Left ventricular ejection fraction, by estimation, is 60 to 65%. The left ventricle has normal function. The left ventricle has no regional wall motion abnormalities. There is severe left ventricular hypertrophy. Left ventricular diastolic parameters  are consistent with Grade I diastolic dysfunction (impaired relaxation).  2. Right ventricular systolic function is normal. The right ventricular size is normal. Tricuspid regurgitation signal is inadequate for assessing PA pressure.  3. The mitral valve is grossly normal. Trivial mitral valve regurgitation.  4. The aortic valve is tricuspid. Aortic valve regurgitation is not visualized.  5. Aortic dilatation noted. There is mild dilatation of the aortic root, measuring 40 mm.  6. The inferior vena cava is normal in size with greater than 50% respiratory variability, suggesting right atrial pressure of 3 mmHg. Comparison(s): No prior Echocardiogram. FINDINGS  Left Ventricle: Left ventricular ejection fraction, by estimation, is 60 to 65%. The left ventricle has normal function. The left ventricle has no regional wall motion abnormalities. The left ventricular internal cavity size was normal in size. There is  severe left ventricular hypertrophy. Left ventricular diastolic parameters are consistent with Grade I diastolic dysfunction (impaired relaxation). Indeterminate filling pressures. Right Ventricle: The right ventricular size is normal. No increase in right ventricular wall thickness. Right ventricular systolic function is normal. Tricuspid regurgitation signal is inadequate for assessing PA pressure. Left Atrium: Left atrial size was normal in size. Right  Atrium: Right atrial size was normal in size. Pericardium: There is no evidence of pericardial effusion. Mitral Valve: The mitral valve is grossly normal. Trivial mitral valve regurgitation. Tricuspid Valve: The tricuspid valve is grossly normal. Tricuspid valve regurgitation is trivial. Aortic Valve: The aortic valve is tricuspid. Aortic valve regurgitation is not visualized. Aortic valve mean gradient measures 3.0 mmHg. Aortic valve peak gradient measures 4.8 mmHg. Aortic valve area, by VTI measures 2.87 cm. Pulmonic Valve: The pulmonic valve was grossly normal. Pulmonic valve regurgitation is not visualized. Aorta: Aortic dilatation noted. There is mild dilatation of the aortic root, measuring 40 mm. Venous: The inferior vena cava is normal in size with greater than 50% respiratory variability, suggesting right atrial pressure of 3 mmHg. IAS/Shunts: No atrial level shunt detected by color flow Doppler.  LEFT VENTRICLE PLAX 2D LVIDd:         3.80 cm  Diastology LVIDs:         2.70 cm  LV e' medial:    9.25 cm/s LV PW:         1.60 cm  LV E/e' medial:  8.4 LV IVS:        1.60 cm  LV e' lateral:   11.10 cm/s LVOT diam:     2.10 cm  LV E/e' lateral: 7.0 LV SV:         69 LV  SV Index:   30 LVOT Area:     3.46 cm  RIGHT VENTRICLE          IVC RV Basal diam:  4.50 cm  IVC diam: 1.40 cm RV Mid diam:    3.20 cm TAPSE (M-mode): 2.9 cm LEFT ATRIUM             Index       RIGHT ATRIUM           Index LA diam:        3.60 cm 1.56 cm/m  RA Area:     18.70 cm LA Vol (A2C):   66.8 ml 29.02 ml/m RA Volume:   51.80 ml  22.50 ml/m LA Vol (A4C):   46.9 ml 20.37 ml/m LA Biplane Vol: 55.9 ml 24.28 ml/m  AORTIC VALVE AV Area (Vmax):    3.27 cm AV Area (Vmean):   2.68 cm AV Area (VTI):     2.87 cm AV Vmax:           110.00 cm/s AV Vmean:          79.300 cm/s AV VTI:            0.241 m AV Peak Grad:      4.8 mmHg AV Mean Grad:      3.0 mmHg LVOT Vmax:         104.00 cm/s LVOT Vmean:        61.400 cm/s LVOT VTI:          0.200  m LVOT/AV VTI ratio: 0.83  AORTA Ao Root diam: 4.00 cm Ao Asc diam:  3.40 cm MITRAL VALVE MV Area (PHT): 3.72 cm    SHUNTS MV Decel Time: 204 msec    Systemic VTI:  0.20 m MV E velocity: 78.00 cm/s  Systemic Diam: 2.10 cm MV A velocity: 63.80 cm/s MV E/A ratio:  1.22 Lyman Bishop MD Electronically signed by Lyman Bishop MD Signature Date/Time: 09/11/2020/9:57:23 AM    Final    VAS US CAROTID (at [facility] and [facility] only)  Result Date: 09/11/2020 Carotid Arterial Duplex Study Indications:       TIA. Risk Factors:      Hypertension, hyperlipidemia. Comparison Study:  no prior Performing Technologist: Abram Sander RVS  Examination Guidelines: A complete evaluation includes B-mode imaging, spectral Doppler, color Doppler, and power Doppler as needed of all accessible portions of each vessel. Bilateral testing is considered an integral part of a complete examination. Limited examinations for reoccurring indications may be performed as noted.  Right Carotid Findings: +----------+--------+--------+--------+------------------+--------+           PSV cm/sEDV cm/sStenosisPlaque DescriptionComments +----------+--------+--------+--------+------------------+--------+ CCA Prox  93      21              heterogenous               +----------+--------+--------+--------+------------------+--------+ CCA Distal71      19              heterogenous               +----------+--------+--------+--------+------------------+--------+ ICA Prox  77      28      1-39%   heterogenous               +----------+--------+--------+--------+------------------+--------+ ICA Distal69      25                                         +----------+--------+--------+--------+------------------+--------+  ECA       170     29                                         +----------+--------+--------+--------+------------------+--------+ +----------+--------+-------+--------+-------------------+           PSV cm/sEDV  cmsDescribeArm Pressure (mmHG) +----------+--------+-------+--------+-------------------+ Subclavian130                                        +----------+--------+-------+--------+-------------------+ +---------+--------+--+--------+--+---------+ VertebralPSV cm/s64EDV cm/s15Antegrade +---------+--------+--+--------+--+---------+  Left Carotid Findings: +----------+--------+--------+--------+------------------+--------+           PSV cm/sEDV cm/sStenosisPlaque DescriptionComments +----------+--------+--------+--------+------------------+--------+ CCA Prox  125     21              heterogenous               +----------+--------+--------+--------+------------------+--------+ CCA Distal87      21              heterogenous               +----------+--------+--------+--------+------------------+--------+ ICA Prox  86      26      1-39%   heterogenous               +----------+--------+--------+--------+------------------+--------+ ICA Distal92      34                                         +----------+--------+--------+--------+------------------+--------+ ECA       101     16                                         +----------+--------+--------+--------+------------------+--------+ +----------+--------+--------+--------+-------------------+           PSV cm/sEDV cm/sDescribeArm Pressure (mmHG) +----------+--------+--------+--------+-------------------+ [id number]                                          +----------+--------+--------+--------+-------------------+ +---------+--------+--+--------+--+---------+ VertebralPSV cm/s70EDV cm/s14Antegrade +---------+--------+--+--------+--+---------+   Summary: Right Carotid: Velocities in the right ICA are consistent with a 1-39% stenosis. Left Carotid: Velocities in the left ICA are consistent with a 1-39% stenosis. Vertebrals: Bilateral vertebral arteries demonstrate antegrade flow. *See table(s)  above for measurements and observations.  Electronically signed by Monica Martinez MD on 09/11/2020 at 4:50:48 PM.    Final    VAS [place] LOWER EXTREMITY VENOUS (DVT)  Result Date: 09/11/2020  Lower Venous DVT Study Indications: Stroke.  Comparison Study: no prior Performing Technologist: Abram Sander RVS  Examination Guidelines: A complete evaluation includes B-mode imaging, spectral Doppler, color Doppler, and power Doppler as needed of all accessible portions of each vessel. Bilateral testing is considered an integral part of a complete examination. Limited examinations for reoccurring indications may be performed as noted. The reflux portion of the exam is performed with the patient in reverse Trendelenburg.  +---------+---------------+---------+-----------+----------+--------------+ RIGHT    CompressibilityPhasicitySpontaneityPropertiesThrombus Aging +---------+---------------+---------+-----------+----------+--------------+ CFV      Full           Yes      Yes                                 +---------+---------------+---------+-----------+----------+--------------+  SFJ      Full                                                        +---------+---------------+---------+-----------+----------+--------------+ FV Prox  Full                                                        +---------+---------------+---------+-----------+----------+--------------+ FV Mid   Full                                                        +---------+---------------+---------+-----------+----------+--------------+ FV DistalFull                                                        +---------+---------------+---------+-----------+----------+--------------+ PFV      Full                                                        +---------+---------------+---------+-----------+----------+--------------+ POP      Full           Yes      Yes                                  +---------+---------------+---------+-----------+----------+--------------+ PTV      Full                                                        +---------+---------------+---------+-----------+----------+--------------+ PERO     Full                                                        +---------+---------------+---------+-----------+----------+--------------+   +---------+---------------+---------+-----------+----------+--------------+ LEFT     CompressibilityPhasicitySpontaneityPropertiesThrombus Aging +---------+---------------+---------+-----------+----------+--------------+ CFV      Full           Yes      Yes                                 +---------+---------------+---------+-----------+----------+--------------+ SFJ      Full                                                        +---------+---------------+---------+-----------+----------+--------------+  FV Prox  Full                                                        +---------+---------------+---------+-----------+----------+--------------+ FV Mid   Full                                                        +---------+---------------+---------+-----------+----------+--------------+ FV DistalFull                                                        +---------+---------------+---------+-----------+----------+--------------+ PFV      Full                                                        +---------+---------------+---------+-----------+----------+--------------+ POP      Full           Yes      Yes                                 +---------+---------------+---------+-----------+----------+--------------+ PTV      Full                                                        +---------+---------------+---------+-----------+----------+--------------+ PERO     Full                                                         +---------+---------------+---------+-----------+----------+--------------+     Summary: BILATERAL: - No evidence of deep vein thrombosis seen in the lower extremities, bilaterally. - No evidence of superficial venous thrombosis in the lower extremities, bilaterally. -No evidence of popliteal cyst, bilaterally.   *See table(s) above for measurements and observations. Electronically signed by Monica Martinez MD on 09/11/2020 at 4:51:57 PM.    Final      LOS: 0 days   Antonieta Pert, MD [facility]  09/12/2020, 1:12 PM

## 2020-09-12 NOTE — Plan of Care (Signed)
  Problem: Education: Goal: Individualized Educational Video(s) Outcome: Progressing   Problem: Education: Goal: Knowledge of patient specific risk factors addressed and post discharge goals established will improve Outcome: Progressing   Problem: Education: Goal: Knowledge of secondary prevention will improve Outcome: Progressing

## 2020-09-12 NOTE — CV Procedure (Addendum)
    TRANSESOPHAGEAL ECHOCARDIOGRAM   NAME:  Gabriel Lin   MRN: [id number] DOB:  July 21, 1961   ADMIT DATE: 09/10/2020  INDICATIONS: Stroke  PROCEDURE:   Informed consent was obtained prior to the procedure. The risks, benefits and alternatives for the procedure were discussed and the patient comprehended these risks.  Risks include, but are not limited to, cough, sore throat, vomiting, nausea, somnolence, esophageal and stomach trauma or perforation, bleeding, low blood pressure, aspiration, pneumonia, infection, trauma to the teeth and death.    Procedural time out performed. The oropharynx was anesthetized with topical 1% cetacaine.    Patient received monitored anesthesia care under the supervision of Dr. Ermalene Postin. Patient received a total of 498 mg propofol during the procedure.  The transesophageal probe was inserted in the esophagus and stomach without difficulty and multiple views were obtained.    COMPLICATIONS:    There were no immediate complications.  FINDINGS:  LEFT VENTRICLE: EF = 60-65%. No regional wall motion abnormalities.  RIGHT VENTRICLE: Normal size and function.   LEFT ATRIUM: No thrombus/mass.  LEFT ATRIAL APPENDAGE: No thrombus/mass.   RIGHT ATRIUM: No thrombus/mass.  AORTIC VALVE:  Trileaflet. No regurgitation. No vegetation.  MITRAL VALVE:    Normal structure. Trivial regurgitation. No vegetation.  TRICUSPID VALVE: Normal structure. Trivial regurgitation. No vegetation.  PULMONIC VALVE: Grossly normal structure. Trivial regurgitation. No apparent vegetation.  INTERATRIAL SEPTUM: There are brief glimpses that suggest flow across intra-atrial septum by color Doppler. Bubble study showed right to left shunting (initial two beats not captured on echo but rapid bubbles seen in left atrium consistent with intra-atrial shunt).  PERICARDIUM: Small focal effusion noted near RV.  DESCENDING AORTA: Mild diffuse plaque seen   CONCLUSION: While no ASD/PFO  definitively seen, there are brief glimpses with color Doppler that suggest intra-atrial shunting. The bubble study is also positive for right to left shunting, consistent with intra-atrial shunt. No other cardiac source of embolism noted.   Buford Dresser, MD, PhD [facility]  [address], [place], [place] [address] [phone]   9:39 AM

## 2020-09-12 NOTE — H&P (View-Only) (Signed)
ELECTROPHYSIOLOGY CONSULT NOTE  Patient ID: Gabriel Lin MRN: [id number], DOB/AGE: [birth year]   Admit date: 09/10/2020 Date of Consult: 09/12/2020  Primary Physician: Galen Manila, MD Primary Cardiologist: none Reason for Consultation: Cryptogenic stroke ; recommendations regarding Implantable Loop Recorder, requested by Dr. Leonie Man  History of Present Illness Gabriel Lin was admitted on 09/10/2020 with stroke.    PMHx include HTN, HLD, smoker  Neurology notes;Stroke, left frontal lobe likely of cryptogenic etiology .  he has undergone workup for stroke including echocardiogram and carotid dopplers.  The patient has been monitored on telemetry which has demonstrated sinus rhythm with no arrhythmias   Dr. Leonie Man notes; Carotid ultrasound shows no significant extracranial stenosis.  Lower extremity venous Dopplers negative for DVT.  Transcranial Doppler bubble study was personally performed by me at the bedside and shows a few bubbles with slight increase after Valsalva maneuver but likely clinically insignificant right-to-left shunt.  Transesophageal echocardiogram performed earlier today shows no evidence of intra-atrial clot or PFO but did show presence of intra-atrial bubble suggestive of small right-to-left shunt Likely trivial Requests evaluation for loop implant   TEE done today LEFT VENTRICLE: EF = 60-65%. No regional wall motion abnormalities.  RIGHT VENTRICLE: Normal size and function.   LEFT ATRIUM: No thrombus/mass.  LEFT ATRIAL APPENDAGE: No thrombus/mass.   RIGHT ATRIUM: No thrombus/mass.  AORTIC VALVE:  Trileaflet. No regurgitation. No vegetation.  MITRAL VALVE:    Normal structure. Trivial regurgitation. No vegetation.  TRICUSPID VALVE: Normal structure. Trivial regurgitation. No vegetation.  PULMONIC VALVE: Grossly normal structure. Trivial regurgitation. No apparent vegetation.  INTERATRIAL SEPTUM: No PFO or ASD seen by color Doppler.  However, bubble study showed right to left shunting (initial two beats not captured on echo but rapid bubbles seen in left atrium consistent with intra-atrial shunt).  PERICARDIUM: Small focal posterior effusion noted.  DESCENDING AORTA: Mild diffuse plaque seen  CONCLUSION: While no ASD/PFO seen with Doppler, the bubble study is positive for right to left shunting, consistent with intra-atrial shunt. No other cardiac source of embolism noted.   TCD 09/12/20 Summary:  A vascular evaluation was performed. The right middle cerebral artery was  studied. An IV was inserted into the patient's left forearm. Verbal  informed consent was obtained.    Few HITS heard with valsalva maneuver, suggestive of small, likely  clinically insignificant PFO.   Lab work is reviewed.   Prior to admission, the patient denies chest pain, shortness of breath, dizziness, or syncope.   He does mention maybe every few/several months when he drinks a lot of caffeine he will get a couple palpitations, these are momentary  They are recovering from their stroke with plans to home at discharge     Past Medical History:  Diagnosis Date  . Cataract    "early"  . GERD (gastroesophageal reflux disease)   . Hyperlipidemia    no meds taken  . Hypertension   . Sleep apnea    wears CPA     Surgical History:  Past Surgical History:  Procedure Laterality Date  . COLONOSCOPY    . HERNIA REPAIR     double hernia repair- 2005 ?  [facility] Kitchen LAMINECTOMY       Medications Prior to Admission  Medication Sig Dispense Refill Last Dose  . atenolol (TENORMIN) 50 MG tablet Take 50 mg by mouth daily.    09/10/2020 at 0600  . ergocalciferol (VITAMIN D2) 1.25 MG (50000 UT) capsule Take 50,000 Units by mouth every 7 (seven) days.  Past Week at Unknown time  . esomeprazole (NEXIUM) 40 MG capsule Take 1 tablet by mouth daily.   09/10/2020 at Unknown time  . melatonin 3 MG TABS tablet Take 6 mg by mouth at bedtime as needed  (For sleep).   09/09/2020 at Unknown time    Inpatient Medications:  .  stroke: mapping our early stages of recovery book   Does not apply Once  . aspirin EC  81 mg Oral Daily  . atenolol  50 mg Oral Daily  . clopidogrel  75 mg Oral Daily  . pantoprazole  40 mg Oral Daily    Allergies: No Known Allergies  Social History   Socioeconomic History  . Marital status: Single    Spouse name: Not on file  . Number of children: Not on file  . Years of education: Not on file  . Highest education level: Not on file  Occupational History  . Not on file  Tobacco Use  . Smoking status: Former Research scientist (life sciences)  . Smokeless tobacco: Never Used  . Tobacco comment: 2012 quit  Vaping Use  . Vaping Use: Some days  Substance and Sexual Activity  . Alcohol use: Not Currently  . Drug use: Never  . Sexual activity: Not on file  Other Topics Concern  . Not on file  Social History Narrative  . Not on file   Social Determinants of Health   Financial Resource Strain:   . Difficulty of Paying Living Expenses: Not on file  Food Insecurity:   . Worried About Charity fundraiser in the Last Year: Not on file  . Ran Out of Food in the Last Year: Not on file  Transportation Needs:   . Lack of Transportation (Medical): Not on file  . Lack of Transportation (Non-Medical): Not on file  Physical Activity:   . Days of Exercise per Week: Not on file  . Minutes of Exercise per Session: Not on file  Stress:   . Feeling of Stress : Not on file  Social Connections:   . Frequency of Communication with Friends and Family: Not on file  . Frequency of Social Gatherings with Friends and Family: Not on file  . Attends Religious Services: Not on file  . Active Member of Clubs or Organizations: Not on file  . Attends Archivist Meetings: Not on file  . Marital Status: Not on file  Intimate Partner Violence:   . Fear of Current or Ex-Partner: Not on file  . Emotionally Abused: Not on file  . Physically  Abused: Not on file  . Sexually Abused: Not on file     Family History  Problem Relation Age of Onset  . Colon cancer Brother 79  . Colon polyps Brother   . Esophageal cancer Neg Hx   . Rectal cancer Neg Hx   . Stomach cancer Neg Hx       Review of Systems: All other systems reviewed and are otherwise negative except as noted above.  Physical Exam: Vitals:   09/12/20 0955 09/12/20 1000 09/12/20 1005 09/12/20 1142  BP: (!) 142/90 (!) 132/108 125/72 (!) 142/83  Pulse: 88 85 85 85  Resp: 17 13 (!) 21 16  Temp:    98 F (36.7 C)  TempSrc:    Oral  SpO2: 97% 96% 95% 99%  Weight:      Height:        GEN- The patient is well appearing, alert and oriented x 3 today.   Head-  normocephalic, atraumatic Eyes-  Sclera clear, conjunctiva pink Ears- hearing intact Oropharynx- clear Neck- supple Lungs-CTA, normal work of breathing Heart- RRR, no murmurs, rubs or gallops  GI- soft, NT, ND Extremities- no clubbing, cyanosis, or edema MS- no significant deformity or atrophy Skin- no rash or lesion Psych- euthymic mood, full affect   Labs:   Lab Results  Component Value Date   WBC 10.4 09/10/2020   HGB 17.9 (H) 09/10/2020   HCT 52.4 (H) 09/10/2020   MCV 92.4 09/10/2020   PLT 195 09/10/2020    Recent Labs  Lab 09/10/20 1509 09/11/20 1409 09/12/20 0403  NA 138   < > 137  K 4.3   < > 4.2  CL 103   < > 104  CO2 29   < > 22  BUN 29*   < > 28*  CREATININE 1.75*   < > 1.71*  CALCIUM 9.0   < > 8.9  PROT 8.0  --   --   BILITOT 0.7  --   --   ALKPHOS 78  --   --   ALT 40  --   --   AST 25  --   --   GLUCOSE 111*   < > 104*   < > = values in this interval not displayed.   No results found for: CKTOTAL, CKMB, CKMBINDEX, TROPONINI Lab Results  Component Value Date   CHOL 217 (H) 09/11/2020   Lab Results  Component Value Date   HDL 41 09/11/2020   Lab Results  Component Value Date   LDLCALC 142 (H) 09/11/2020   Lab Results  Component Value Date   TRIG 170 (H)  09/11/2020   Lab Results  Component Value Date   CHOLHDL 5.3 09/11/2020   No results found for: LDLDIRECT  No results found for: DDIMER   Radiology/Studies:   CT HEAD WO CONTRAST Result Date: 09/10/2020 CLINICAL DATA:  Right-sided facial droop with headache and numbness beginning last night. Symptoms resolving. EXAM: CT HEAD WITHOUT CONTRAST TECHNIQUE: Contiguous axial images were obtained from the base of the skull through the vertex without intravenous contrast. COMPARISON:  None. FINDINGS: Brain: Ventricles, cisterns and other CSF spaces are normal. There is no mass, mass effect, shift of midline structures or acute hemorrhage. No evidence of acute infarction. Vascular: No hyperdense vessel or unexpected calcification. Skull: Normal. Negative for fracture or focal lesion. Sinuses/Orbits: No acute finding. Other: None. IMPRESSION: No acute findings. Electronically Signed   By: Marin Olp M.D.   On: 09/10/2020 15:59    MR ANGIO HEAD WO CONTRAST Result Date: 09/11/2020 CLINICAL DATA:  Initial evaluation for acute facial droop, right hand numbness. EXAM: MRI HEAD WITHOUT CONTRAST MRA HEAD WITHOUT CONTRAST MRA NECK WITHOUT AND WITH CONTRAST TECHNIQUE: Multiplanar, multiecho pulse sequences of the brain and surrounding structures were obtained without intravenous contrast. Angiographic images of the Circle of Willis were obtained using MRA technique without intravenous contrast. Angiographic images of the neck were obtained using MRA technique without and with intravenous contrast. Carotid stenosis measurements (when applicable) are obtained utilizing NASCET criteria, using the distal internal carotid diameter as the denominator. CONTRAST:  55mL GADAVIST GADOBUTROL 1 MMOL/ML IV SOLN COMPARISON:  Prior CT from 09/10/2020. FINDINGS: MRI HEAD FINDINGS Brain: Cerebral volume within normal limits for age. Few scattered subcentimeter foci of FLAIR hyperintensity noted involving the supratentorial  cerebral white matter, nonspecific, but felt to be within normal limits for age. There is a subtle punctate 4 mm focus of diffusion  abnormality involving the cortex of the posterior left frontal lobe, left precentral gyrus (series 5, image 87). Given the patient's right-sided symptoms, finding is suspicious for a tiny acute ischemic infarct. Difficult to discern this finding on corresponding ADC map given small size. No associated hemorrhage or mass effect. No other foci of restricted diffusion to suggest acute or subacute ischemia. Gray-white matter differentiation otherwise maintained. No encephalomalacia to suggest chronic cortical infarction elsewhere within the brain. No foci of susceptibility artifact to suggest acute or chronic intracranial hemorrhage. No mass lesion, midline shift or mass effect. Ventricles normal size without hydrocephalus. No extra-axial fluid collection. Pituitary gland suprasellar region normal. Midline structures intact. Vascular: Major intracranial vascular flow voids are maintained. Skull and upper cervical spine: Craniocervical junction within normal limits. Bone marrow signal intensity normal. No scalp soft tissue abnormality. Sinuses/Orbits: Globes and orbital soft tissues within normal limits. Tiny right maxillary sinus retention cyst noted. Right-to-left nasal septal deviation with associated concha bullosa. Moderate right with small left mastoid effusions. Visualized nasopharynx within normal limits. Inner ear structures normal. Other: None. MRA HEAD FINDINGS ANTERIOR CIRCULATION: Visualized distal cervical segments of the internal carotid arteries are patent with symmetric antegrade flow. Petrous, cavernous, and supraclinoid segments patent without hemodynamically significant stenosis or other abnormality. A1 segments patent bilaterally. Normal anterior communicating artery complex. Anterior cerebral arteries patent to their distal aspects without stenosis. No M1 stenosis or  occlusion. Normal MCA bifurcations. Distal MCA branches well perfused and symmetric. POSTERIOR CIRCULATION: Vertebral artery somewhat diminutive but are patent to the vertebrobasilar junction without stenosis. Right vertebral artery slightly dominant. Both PICA patent. Basilar diminutive but patent to its distal aspect without stenosis. Superior cerebral arteries patent bilaterally. 2-3 mm outpouching at the origin of the right SCA felt to be most consistent with a vascular infundibulum. Fetal type origin of the right PCA. Left PCA supplied via the basilar as well as a robust left posterior communicating artery. Both PCAs well perfused to their distal aspects without stenosis. No intracranial aneurysm. MRA NECK FINDINGS AORTIC ARCH: Visualized aortic arch of normal caliber with normal 3 vessel morphology. No hemodynamically significant stenosis seen about the origin of the great vessels. RIGHT CAROTID SYSTEM: Right CCA patent from its origin to the bifurcation without stenosis. No significant atheromatous narrowing seen about the right bifurcation. Right ICA widely patent distally without stenosis, evidence for dissection or occlusion. LEFT CAROTID SYSTEM: Left CCA patent from its origin to the bifurcation without stenosis. Short-segment atheromatous stenosis of up to approximately 40% seen at the origin of the left ICA (series 1098, image 1). Slight poststenotic dilatation. Left ICA patent distally to the skull base without stenosis, evidence for dissection, or occlusion. VERTEBRAL ARTERIES: Both vertebral arteries arise from the subclavian arteries. No proximal subclavian artery stenosis. Right vertebral artery dominant with a diffusely hypoplastic left vertebral artery. Both vertebral arteries patent within the neck without stenosis, evidence for dissection or occlusion. IMPRESSION: MRI HEAD IMPRESSION: 1. Subtle punctate 4 mm focus of diffusion abnormality involving the posterior left frontal lobe, left  precentral gyrus, suspicious for a tiny acute ischemic infarct. No associated hemorrhage or mass effect. 2. Otherwise normal brain MRI for age. MRA HEAD IMPRESSION: Negative intracranial MRA. No large vessel occlusion, hemodynamically significant stenosis, or other acute vascular abnormality. MRA NECK IMPRESSION: 1. 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 2. Otherwise wide patency of both carotid artery systems within the neck. 3. Wide patency of both vertebral arteries within the neck. Right vertebral artery  slightly dominant. Electronically Signed   By: Jeannine Boga M.D.   On: 09/11/2020 02:19     VAS [place] TRANSCRANIAL DOPPLER W BUBBLES Result Date: 09/12/2020  Transcranial Doppler with Bubble Indications: Stroke. History: Hypertension, hyperlipidemia. Comparison Study: No prior study Performing Technologist: Maudry Mayhew MHA, RDMS, RVT, RDCS  Examination Guidelines: A complete evaluation includes B-mode imaging, spectral Doppler, color Doppler, and power Doppler as needed of all accessible portions of each vessel. Bilateral testing is considered an integral part of a complete examination. Limited examinations for reoccurring indications may be performed as noted.  Summary:  A vascular evaluation was performed. The right middle cerebral artery was studied. An IV was inserted into the patient's left forearm. Verbal informed consent was obtained.  Few HITS heard with valsalva maneuver, suggestive of small, likely clinically insignificant PFO.  *See table(s) above for TCD measurements and observations.    Preliminary      VAS US CAROTID (at [facility] and [facility] only) Result Date: 09/11/2020 Carotid Arterial Duplex Study Indications:       TIA. Risk Factors:      Hypertension, hyperlipidemia. Comparison Study:  no prior Performing Technologist: Abram Sander RVS  Examination Guidelines: A complete evaluation includes B-mode imaging, spectral Doppler, color Doppler, and power  Doppler as needed of all accessible portions of each vessel. Bilateral testing is considered an integral part of a complete examination. Limited examinations for reoccurring indications may be performed as noted.  Right Carotid Findings  Summary: Right Carotid: Velocities in the right ICA are consistent with a 1-39% stenosis. Left Carotid: Velocities in the left ICA are consistent with a 1-39% stenosis. Vertebrals: Bilateral vertebral arteries demonstrate antegrade flow. *See table(s) above for measurements and observations.  Electronically signed by Monica Martinez MD on 09/11/2020 at 4:50:48 PM.    Final      VAS [place] LOWER EXTREMITY VENOUS (DVT) Result Date: 09/11/2020  Lower Venous DVT Study Indications: Stroke.  Comparison Study: no prior Performing Technologist: Abram Sander RVS  Examination Guidelines: A complete evaluation includes B-mode imaging, spectral Doppler, color Doppler, and power Doppler as needed of all accessible portions of each vessel. Bilateral testing is considered an integral part of a complete examination. Limited examinations for reoccurring indications may be performed as noted. The reflux portion of the exam is performed with the patient in reverse Trendelenburg.    Summary: BILATERAL: - No evidence of deep vein thrombosis seen in the lower extremities, bilaterally. - No evidence of superficial venous thrombosis in the lower extremities, bilaterally. -No evidence of popliteal cyst, bilaterally.   *See table(s) above for measurements and observations. Electronically signed by Monica Martinez MD on 09/11/2020 at 4:51:57 PM.    Final     12-lead ECG SR All prior EKG's in EPIC reviewed with no documented atrial fibrillation  Telemetry SR  Assessment and Plan:  1. Cryptogenic stroke The patient presents with cryptogenic stroke.   The patient had a TEE earlier today with sedation. He is AAOx4, he is able to tell me the conversation he had with Dr. Leonie Man about loop  ,onitor and why.  I spoke at length with the patient about monitoring for afib with either a 30 day event monitor or an implantable loop recorder.  Risks, benefits, and alteratives to implantable loop recorder were discussed with the patient today.    While I am with him Dr. Leonie Man called the patient, mentions that he had a cardiologist review his TEE and feels he has a small PFO and was going  to refer him to discuss it outpatient. I spoke with Dr. Leonie Man, he feels given the very small amount of bubbles noted and other testing, very small PFO he still would like AFib surveillance with loop monitoring implant. The patient was able to repeat back to me and his wife the above and states clear understanding and theywould like to proceed with loop  Wound care was reviewed with the patient (keep incision clean and dry for 3 days).  Wound check will be scheduled for the patient  Please call with questions.   Baldwin [place], PA-C 09/12/2020   I have seen, examined the patient, and reviewed the above assessment and plan.  Changes to above are made where necessary.  On exam, RRR.  The patient has had an embolic stroke of unknown source.  I agree with Dr Leonie Man that long term monitoring is indicated to evaluate for afib as a possible cause. Risks and benefits to ILR implantation were discussed with the patient who wishes to proceed.  Co Sign: [name], MD 09/12/2020 4:15 PM

## 2020-09-12 NOTE — Progress Notes (Signed)
STROKE TEAM PROGRESS NOTE   INTERVAL HISTORY Patient is doing well.  He has had no recurrent stroke or TIA symptoms.  Carotid ultrasound shows no significant extracranial stenosis.  Lower extremity venous Dopplers negative for DVT.  Transcranial Doppler bubble study was personally performed by me at the bedside and shows a few bubbles with slight increase after Valsalva maneuver but likely clinically insignificant right-to-left shunt.  Transesophageal echocardiogram performed earlier today shows no evidence of intra-atrial clot or PFO but did show presence of intra-atrial bubble suggestive of small right-to-left shunt    Vitals:   09/12/20 0955 09/12/20 1000 09/12/20 1005 09/12/20 1142  BP: (!) 142/90 (!) 132/108 125/72 (!) 142/83  Pulse: 88 85 85 85  Resp: 17 13 (!) 21 16  Temp:    98 F (36.7 C)  TempSrc:    Oral  SpO2: 97% 96% 95% 99%  Weight:      Height:       CBC:  Recent Labs  Lab 09/10/20 1509  WBC 10.4  HGB 17.9*  HCT 52.4*  MCV 92.4  PLT 622   Basic Metabolic Panel:  Recent Labs  Lab 09/11/20 1409 09/12/20 0403  NA 138 137  K 3.9 4.2  CL 103 104  CO2 24 22  GLUCOSE 156* 104*  BUN 25* 28*  CREATININE 1.80* 1.71*  CALCIUM 9.2 8.9    Lipid Panel:  Recent Labs  Lab 09/11/20 0140  CHOL 217*  TRIG 170*  HDL 41  CHOLHDL 5.3  VLDL 34  LDLCALC 142*    HgbA1c:  Recent Labs  Lab 09/11/20 0500  HGBA1C 4.9   Urine Drug Screen:  Recent Labs  Lab 09/10/20 1607  LABOPIA NONE DETECTED  COCAINSCRNUR NONE DETECTED  LABBENZ NONE DETECTED  AMPHETMU NONE DETECTED  THCU NONE DETECTED  LABBARB NONE DETECTED    Alcohol Level  Recent Labs  Lab 09/10/20 1509  ETH <10    IMAGING past 24 hours No results found.  PHYSICAL EXAM:  Patient is awake, alert, oriented to person, place, month, year, and situation. Wife presented to room during our assessment.  Patient is able to give a clear and coherent history.  No signs of aphasia or neglect  Cranial  Nerves:  II.  Pupils are equal, round, and reactive to light.    III,IV, VI: EOMI without ptosis or diplopia.  V: Facial sensation is symmetric to temperature VII: Facial movement is symmetric.  VIII: hearing is intact to voice X: Palate elevates symmetrically XI: Shoulder shrug is symmetric. XII: tongue is midline without atrophy or fasciculations.  Motor: Bulk is normal.  5/5 strength all extremities.  Sensory: Sensation is symmetric to light touch and temperature in the arms and legs.  Deep Tendon Reflexes: 2+ and symmetric in the biceps and patellae.   Plantars: Toes are downgoing bilaterally.   Cerebellar: FNF and HKS are without ataxia.      ASSESSMENT/PLAN Gabriel Lin is a 59 y.o. male with history of  Hypertension, dyslipidemia, smoker,  presenting with transient neurological deficit including right arm numbness and speech difficulty, and found by MRI to have a left frontal lobe infarction..   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  Carotid Doppler  : No significant extracranial stenosis.  Doppler BLE given recent travel and family history of DVT.  Negative for DVT  TEE no PFO or intra-atrial clot.  Presence of intra-atrial bubble suggests right-to-left shunt likely trivial  Aspirin 81mg  daily  plavix 300mg  today loading  dose then start plavix 75mg  daily on 09/12/20 for 3 weeks, then continue only with aspirin 81mg  daily  Follow up with Dr. Leonie Man six weeks post hospital discharge.   2D Echo :  Left Ventricle: Left ventricular ejection fraction, by estimation, is 60 to 65%. The left ventricle has normal function.   Left Atrium: Left atrial size was normal in size.  IAS/Shunts: No atrial level shunt detected by color flow Doppler.   LDL 142  HgbA1c 4.9  VTE prophylaxis - SCD    Diet   Diet Heart Room service appropriate? Yes; Fluid consistency: Thin    Therapy recommendations:  Discharge to home without therapy  Disposition:   Home when stable.   Hypertension  Home meds:  Tenormin 50mg  daily  Stable . Permissive hypertension (OK if < 220/120) but gradually normalize in 5-7 days . Long-term BP goal normotensive  Hyperlipidemia  Home meds: non: has history of NASH on statin tehraepy  LDL 142, goal < 70   CBGs Recent Labs    09/10/20 1509  GLUCAP 113*      Other Stroke Risk Factors   Cigarette smoker: advised to stop smoking/ vaping   Obesity, Body mass index is 32.55 kg/m., BMI >/= 30 associated with increased stroke risk, recommend weight loss, diet and exercise as appropriate    Obstructive sleep apnea,  on CPAP at home  Hospital day # 0 He presented with transient 30 second episode of right hand paresthesias and speech difficulties and MRI shows tiny punctate left frontal embolic infarct of cryptogenic etiology.  TEE and TCD bubble study showed presence of tiny clinically insignificant right-to-left shunt and lower extremity venous Dopplers were negative for DVT.  Recommend aspirin and Plavix for 3 weeks followed by aspirin alone and aggressive risk factor modification.  Added statin for elevated lipids.    Long discussion with patient and  answered questions.  Patient counseled to quit vape.  Patient can be discharged home later today after placement of loop recorder for paroxysmal A. fib.  Discussed with EP nurse practitioner.   Discussed with Dr. Antonieta Pert.  Greater than 50% time during the 25-minute visit was spent on counseling and coordination of care about his embolic stroke answering questions and discussion with patient and wife.  Stroke team will sign off.  Kindly call for questions. Gabriel Contras, MD  To contact Stroke Continuity provider, please refer to [email or web address] After hours, contact General Neurology

## 2020-09-12 NOTE — TOC Transition Note (Signed)
Transition of Care [facility]) - CM/SW Discharge Note   Patient Details  Name: Gabriel Lin MRN: [id number] Date of Birth: 1960-12-10  Transition of Care [facility]) CM/SW Contact:  Pollie Friar, RN Phone Number: 09/12/2020, 3:54 PM   Clinical Narrative:    Pt is discharging home with self care. No needs per TOC.   Final next level of care: Home/Self Care Barriers to Discharge: No Barriers Identified   Patient Goals and CMS Choice        Discharge Placement                       Discharge Plan and Services                                     Social Determinants of Health (SDOH) Interventions     Readmission Risk Interventions No flowsheet data found.

## 2020-09-12 NOTE — Progress Notes (Signed)
  Echocardiogram Echocardiogram Transesophageal has been performed.  Gabriel Lin 09/12/2020, 10:15 AM

## 2020-09-12 NOTE — Discharge Instructions (Signed)
Implant site/wound care instructions °Keep incision clean and dry for 3 days. °You can remove outer dressing tomorrow. °Leave steri-strips (little pieces of tape) on until seen in the office for wound check appointment. °Call the office ([phone]) for redness, drainage, swelling, or fever. ° °

## 2020-09-12 NOTE — Progress Notes (Signed)
TCD bubble study completed with Dr. Leonie Man. Refer to "CV Proc" under chart review to view preliminary results.  09/12/2020 2:28 PM Kelby Aline., MHA, RVT, RDCS, RDMS

## 2020-09-12 NOTE — Transfer of Care (Signed)
Immediate Anesthesia Transfer of Care Note  Patient: Gabriel Lin  Procedure(s) Performed: TRANSESOPHAGEAL ECHOCARDIOGRAM (TEE) (N/A ) BUBBLE STUDY  Patient Location: Endoscopy Unit  Anesthesia Type:MAC  Level of Consciousness: awake, alert  and patient cooperative  Airway & Oxygen Therapy: Patient Spontanous Breathing  Post-op Assessment: Report given to RN and Post -op Vital signs reviewed and stable  Post vital signs: Reviewed and stable  Last Vitals:  Vitals Value Taken Time  BP 143/80 09/12/20 0946  Temp    Pulse 100 09/12/20 0947  Resp 17 09/12/20 0947  SpO2 96 % 09/12/20 0947  Vitals shown include unvalidated device data.  Last Pain:  Vitals:   09/12/20 0810  TempSrc: Temporal  PainSc: 0-No pain         Complications: No complications documented.

## 2020-09-12 NOTE — Interval H&P Note (Signed)
History and Physical Interval Note:  09/12/2020 4:19 PM  Gabriel Lin  has presented today for surgery, with the diagnosis of syncope.  The various methods of treatment have been discussed with the patient and family. After consideration of risks, benefits and other options for treatment, the patient has consented to  Procedure(s): LOOP RECORDER INSERTION (N/A) as a surgical intervention.  The patient's history has been reviewed, patient examined, no change in status, stable for surgery.  I have reviewed the patient's chart and labs.  Questions were answered to the patient's satisfaction.     [name]

## 2020-09-12 NOTE — Interval H&P Note (Signed)
History and Physical Interval Note:  09/12/2020 8:49 AM  Gabriel Lin  has presented today for surgery, with the diagnosis of STOKE.  The various methods of treatment have been discussed with the patient and family. After consideration of risks, benefits and other options for treatment, the patient has consented to  Procedure(s): TRANSESOPHAGEAL ECHOCARDIOGRAM (TEE) (N/A) as a surgical intervention.  The patient's history has been reviewed, patient examined, no change in status, stable for surgery.  I have reviewed the patient's chart and labs.  Questions were answered to the patient's satisfaction.     Arryn Terrones Harrell Gave

## 2020-09-12 NOTE — Discharge Summary (Signed)
Physician Discharge Summary  Sencere Symonette YQM:[id number] DOB: February 16, 1961 DOA: 09/10/2020  PCP: Galen Manila, MD  Admit date: 09/10/2020 Discharge date: 09/12/2020  Admitted From: home Disposition:  home  Recommendations for Outpatient Follow-up:  1. Follow up with PCP in 1-2 weeks 2. Please obtain BMP/CBC in one week 3. Please follow up on the following pending results:  Home Health:no  Equipment/Devices: none  Discharge Condition: Stable Code Status:   Code Status: Full Code Diet recommendation:  Diet Order            Diet Heart Room service appropriate? Yes; Fluid consistency: Thin  Diet effective now           Diet - low sodium heart healthy                 Brief/Interim Summary: 59 year old male with history of hypertension, hyperlipidemia, and restless presented with transient speech difficulty, facial droop and arm discomfort.  As per the report on night of 11/20 he felt weird in arm, then could not talk, speech garbled then immediately improved, took aspirin, called pcp next day and called telemed who told him to go to ED. He was seen in the ED, seen by neurology and admitted for stroke work-up. Patient was admitted Mri came back positive for tiny acute stroke left frontal lobe, patient was kept in hospital for ongoing work-up with echocardiogram, carotid Dopplers, duplex of the leg, neuro check monitoring. He was seen by neurology in consultation.  He underwent TEE but positive for right-to-left shunting consistent with intra-atrial shunt although no ASD or PFO. Patient underwent transcranial Doppler  Today due to abnormal TEE, it was performed by neurology personally and shows a few bubbles with slight increase after Valsalva maneuver and felt to be likely insignificant right-to-left shunt. Neurology has advised loop recorder which will be placed today. Initial plan was to obtain CT chest in the morning after hydration but given the small insignificant left right  to left shunting neurology advised to discharge home today.  He will go home on aspirin Plavix for 3 weeks afterwards only aspirin alone.  He will follow up with hematology for his polycythemia and discuss with his PCP regarding statin.  Discharge Diagnoses:   Acute stroke, left frontal lobe secondary to most probably a small vessel disease: Patient underwent extensive stroke work-up with-LDL uncontrolled 142 hemoglobin A1c normal 4.9, 2D echocardiogram with EF 60 to 65%, left atrium normal in size.  Carotid doppler 1-39% stenosis b/l, duplex leg.  Question neurology and underwent TEE no ASD/PFO seen with Doppler but the bubble study is positive for right-to-left shunting consistent with intra-atrial shunt.  Per neurology unclear whether the bubble is coming from given the lack of PFO. Patient underwent transcranial Doppler  Today due to abnormal TEE, it was performed by neurology personally and shows a few bubbles with slight increase after Valsalva maneuver and felt to be likely insignificant right-to-left shunt. Neurology has advised loop recorder which will be placed today. Initial plan was to obtain CT chest in the morning after hydration but given the small insignificant left right to left shunting neurology advised to discharge home today.  He will go home on aspirin Plavix for 3 weeks afterwards only aspirin alone  AKI vs progressive CKD stage IIIa: creatinine baseline 1.5 in July 2021, 1.75 on admission , mildly trended up, and holding at 1.7.  He was advised to check BMP with PCP in 1 week.   Polycythemia: noticed elevated hemoglobin since 2009 when  it was at 18.7 gm- 16-17 gm in oct 2019-here in 17 gm range.  Patient is aware about this diagnosis and he was supposed to follow-up with hematology.  I have given him hematology #4: Follow-up. Polycythemia: noticed elevated hemoglobin since 2009 when it was at 18.7 gm- 16-17 gm in oct 2019-here in 17 gm range. Patient changed to inpatient status  due to ongoing work-up of his stroke, at this time since HE completed work-up and no further is needed, he is being discharged home today after placing loop recorder.  Consults:  Cardio  neruo  Subjective: alert awake oriented x3, ambulatory.  No new complaints.  Resting comfortably.  Would like to go home today after lubricated.  Discharge Exam: Vitals:   09/12/20 1005 09/12/20 1142  BP: 125/72 (!) 142/83  Pulse: 85 85  Resp: (!) 21 16  Temp:  98 F (36.7 C)  SpO2: 95% 99%   General: Pt is alert, awake, not in acute distress Cardiovascular: RRR, S1/S2 +, no rubs, no gallops Respiratory: CTA bilaterally, no wheezing, no rhonchi Abdominal: Soft, NT, ND, bowel sounds + Extremities: no edema, no cyanosis  Discharge Instructions  Discharge Instructions    Diet - low sodium heart healthy   Complete by: As directed    Discharge instructions   Complete by: As directed    Please call call MD or return to ER for similar or worsening recurring problem that brought you to hospital or if any fever,nausea/vomiting,abdominal pain, uncontrolled pain, chest pain,  shortness of breath or any other alarming symptoms.  Please follow-up your doctor as instructed in a week time and call the office for appointment.  Here hemoglobin count is elevated in 16-18 gm range for few years now on reviewing your record-I have given referral for hematology follow-up please call the office  Please discuss with your primary care doctor about going back on statin.  Please avoid alcohol, smoking, or any other illicit substance and maintain healthy habits including taking your regular medications as prescribed.  You were cared for by a hospitalist during your hospital stay. If you have any questions about your discharge medications or the care you received while you were in the hospital after you are discharged, you can call the unit and ask to speak with the hospitalist on call if the hospitalist that took  care of you is not available.  Once you are discharged, your primary care physician will handle any further medical issues. Please note that NO REFILLS for any discharge medications will be authorized once you are discharged, as it is imperative that you return to your primary care physician (or establish a relationship with a primary care physician if you do not have one) for your aftercare needs so that they can reassess your need for medications and monitor your lab values   Increase activity slowly   Complete by: As directed      Allergies as of 09/12/2020   No Known Allergies     Medication List    TAKE these medications   aspirin 81 MG EC tablet Take 1 tablet (81 mg total) by mouth daily. Swallow whole. Start taking on: September 13, 2020   atenolol 50 MG tablet Commonly known as: TENORMIN Take 50 mg by mouth daily.   clopidogrel 75 MG tablet Commonly known as: PLAVIX Take 1 tablet (75 mg total) by mouth daily for 20 days. Start taking on: September 13, 2020   ergocalciferol 1.25 MG (50000 UT) capsule Commonly known as:  VITAMIN D2 Take 50,000 Units by mouth every 7 (seven) days.   esomeprazole 40 MG capsule Commonly known as: NEXIUM Take 1 tablet by mouth daily.   melatonin 3 MG Tabs tablet Take 6 mg by mouth at bedtime as needed (For sleep).       Follow-up Information    Ladell Pier, MD Follow up in 2 week(s).   Specialty: Oncology Why: Follow-up due to your elevated hemoglobin Contact information: [place] [address] [phone]        Galen Manila, MD Follow up in 1 week(s).   Specialty: Internal Medicine Contact information: [place] [address] [phone]              No Known Allergies  The results of significant diagnostics from this hospitalization (including imaging, microbiology, ancillary and laboratory) are listed below for reference.    Microbiology: Recent Results (from  the past 240 hour(s))  Resp Panel by RT-PCR (Flu A&B, Covid) Nasopharyngeal Swab     Status: None   Collection Time: 09/10/20  4:58 PM   Specimen: Nasopharyngeal Swab; Nasopharyngeal(NP) swabs in vial transport medium  Result Value Ref Range Status   SARS Coronavirus 2 by RT PCR NEGATIVE NEGATIVE Final    Comment: (NOTE) SARS-CoV-2 target nucleic acids are NOT DETECTED.  The SARS-CoV-2 RNA is generally detectable in upper respiratory specimens during the acute phase of infection. The lowest concentration of SARS-CoV-2 viral copies this assay can detect is 138 copies/mL. A negative result does not preclude SARS-Cov-2 infection and should not be used as the sole basis for treatment or other patient management decisions. A negative result may occur with  improper specimen collection/handling, submission of specimen other than nasopharyngeal swab, presence of viral mutation(s) within the areas targeted by this assay, and inadequate number of viral copies(<138 copies/mL). A negative result must be combined with clinical observations, patient history, and epidemiological information. The expected result is Negative.  Fact Sheet for Patients:  EntrepreneurPulse.com.au  Fact Sheet for Healthcare Providers:  IncredibleEmployment.be  This test is no t yet approved or cleared by the [place] FDA and  has been authorized for detection and/or diagnosis of SARS-CoV-2 by FDA under an Emergency Use Authorization (EUA). This EUA will remain  in effect (meaning this test can be used) for the duration of the COVID-19 declaration under Section 564(b)(1) of the Act, 21 U.S.C.section 360bbb-3(b)(1), unless the authorization is terminated  or revoked sooner.       Influenza A by PCR NEGATIVE NEGATIVE Final   Influenza B by PCR NEGATIVE NEGATIVE Final    Comment: (NOTE) The Xpert Xpress SARS-CoV-2/FLU/RSV plus assay is intended as an aid in the diagnosis of  influenza from Nasopharyngeal swab specimens and should not be used as a sole basis for treatment. Nasal washings and aspirates are unacceptable for Xpert Xpress SARS-CoV-2/FLU/RSV testing.  Fact Sheet for Patients: EntrepreneurPulse.com.au  Fact Sheet for Healthcare Providers: IncredibleEmployment.be  This test is not yet approved or cleared by the [place] FDA and has been authorized for detection and/or diagnosis of SARS-CoV-2 by FDA under an Emergency Use Authorization (EUA). This EUA will remain in effect (meaning this test can be used) for the duration of the COVID-19 declaration under Section 564(b)(1) of the Act, 21 U.S.C. section 360bbb-3(b)(1), unless the authorization is terminated or revoked.  Performed at [facility], [place] [address] [place], [place] [address]     Procedures/Studies: CT HEAD WO CONTRAST  Result  Date: 09/10/2020 CLINICAL DATA:  Right-sided facial droop with headache and numbness beginning last night. Symptoms resolving. EXAM: CT HEAD WITHOUT CONTRAST TECHNIQUE: Contiguous axial images were obtained from the base of the skull through the vertex without intravenous contrast. COMPARISON:  None. FINDINGS: Brain: Ventricles, cisterns and other CSF spaces are normal. There is no mass, mass effect, shift of midline structures or acute hemorrhage. No evidence of acute infarction. Vascular: No hyperdense vessel or unexpected calcification. Skull: Normal. Negative for fracture or focal lesion. Sinuses/Orbits: No acute finding. Other: None. IMPRESSION: No acute findings. Electronically Signed   By: Marin Olp M.D.   On: 09/10/2020 15:59   MR ANGIO HEAD WO CONTRAST  Result Date: 09/11/2020 CLINICAL DATA:  Initial evaluation for acute facial droop, right hand numbness. EXAM: MRI HEAD WITHOUT CONTRAST MRA HEAD WITHOUT CONTRAST MRA NECK WITHOUT AND WITH CONTRAST TECHNIQUE: Multiplanar, multiecho pulse  sequences of the brain and surrounding structures were obtained without intravenous contrast. Angiographic images of the Circle of Willis were obtained using MRA technique without intravenous contrast. Angiographic images of the neck were obtained using MRA technique without and with intravenous contrast. Carotid stenosis measurements (when applicable) are obtained utilizing NASCET criteria, using the distal internal carotid diameter as the denominator. CONTRAST:  85mL GADAVIST GADOBUTROL 1 MMOL/ML IV SOLN COMPARISON:  Prior CT from 09/10/2020. FINDINGS: MRI HEAD FINDINGS Brain: Cerebral volume within normal limits for age. Few scattered subcentimeter foci of FLAIR hyperintensity noted involving the supratentorial cerebral white matter, nonspecific, but felt to be within normal limits for age. There is a subtle punctate 4 mm focus of diffusion abnormality involving the cortex of the posterior left frontal lobe, left precentral gyrus (series 5, image 87). Given the patient's right-sided symptoms, finding is suspicious for a tiny acute ischemic infarct. Difficult to discern this finding on corresponding ADC map given small size. No associated hemorrhage or mass effect. No other foci of restricted diffusion to suggest acute or subacute ischemia. Gray-white matter differentiation otherwise maintained. No encephalomalacia to suggest chronic cortical infarction elsewhere within the brain. No foci of susceptibility artifact to suggest acute or chronic intracranial hemorrhage. No mass lesion, midline shift or mass effect. Ventricles normal size without hydrocephalus. No extra-axial fluid collection. Pituitary gland suprasellar region normal. Midline structures intact. Vascular: Major intracranial vascular flow voids are maintained. Skull and upper cervical spine: Craniocervical junction within normal limits. Bone marrow signal intensity normal. No scalp soft tissue abnormality. Sinuses/Orbits: Globes and orbital soft  tissues within normal limits. Tiny right maxillary sinus retention cyst noted. Right-to-left nasal septal deviation with associated concha bullosa. Moderate right with small left mastoid effusions. Visualized nasopharynx within normal limits. Inner ear structures normal. Other: None. MRA HEAD FINDINGS ANTERIOR CIRCULATION: Visualized distal cervical segments of the internal carotid arteries are patent with symmetric antegrade flow. Petrous, cavernous, and supraclinoid segments patent without hemodynamically significant stenosis or other abnormality. A1 segments patent bilaterally. Normal anterior communicating artery complex. Anterior cerebral arteries patent to their distal aspects without stenosis. No M1 stenosis or occlusion. Normal MCA bifurcations. Distal MCA branches well perfused and symmetric. POSTERIOR CIRCULATION: Vertebral artery somewhat diminutive but are patent to the vertebrobasilar junction without stenosis. Right vertebral artery slightly dominant. Both PICA patent. Basilar diminutive but patent to its distal aspect without stenosis. Superior cerebral arteries patent bilaterally. 2-3 mm outpouching at the origin of the right SCA felt to be most consistent with a vascular infundibulum. Fetal type origin of the right PCA. Left PCA supplied via the basilar as well as  a robust left posterior communicating artery. Both PCAs well perfused to their distal aspects without stenosis. No intracranial aneurysm. MRA NECK FINDINGS AORTIC ARCH: Visualized aortic arch of normal caliber with normal 3 vessel morphology. No hemodynamically significant stenosis seen about the origin of the great vessels. RIGHT CAROTID SYSTEM: Right CCA patent from its origin to the bifurcation without stenosis. No significant atheromatous narrowing seen about the right bifurcation. Right ICA widely patent distally without stenosis, evidence for dissection or occlusion. LEFT CAROTID SYSTEM: Left CCA patent from its origin to the  bifurcation without stenosis. Short-segment atheromatous stenosis of up to approximately 40% seen at the origin of the left ICA (series 1098, image 1). Slight poststenotic dilatation. Left ICA patent distally to the skull base without stenosis, evidence for dissection, or occlusion. VERTEBRAL ARTERIES: Both vertebral arteries arise from the subclavian arteries. No proximal subclavian artery stenosis. Right vertebral artery dominant with a diffusely hypoplastic left vertebral artery. Both vertebral arteries patent within the neck without stenosis, evidence for dissection or occlusion. IMPRESSION: MRI HEAD IMPRESSION: 1. Subtle punctate 4 mm focus of diffusion abnormality involving the posterior left frontal lobe, left precentral gyrus, suspicious for a tiny acute ischemic infarct. No associated hemorrhage or mass effect. 2. Otherwise normal brain MRI for age. MRA HEAD IMPRESSION: Negative intracranial MRA. No large vessel occlusion, hemodynamically significant stenosis, or other acute vascular abnormality. MRA NECK IMPRESSION: 1. 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 2. Otherwise wide patency of both carotid artery systems within the neck. 3. Wide patency of both vertebral arteries within the neck. Right vertebral artery slightly dominant. Electronically Signed   By: Jeannine Boga M.D.   On: 09/11/2020 02:19   MR ANGIO NECK W WO CONTRAST  Result Date: 09/11/2020 CLINICAL DATA:  Initial evaluation for acute facial droop, right hand numbness. EXAM: MRI HEAD WITHOUT CONTRAST MRA HEAD WITHOUT CONTRAST MRA NECK WITHOUT AND WITH CONTRAST TECHNIQUE: Multiplanar, multiecho pulse sequences of the brain and surrounding structures were obtained without intravenous contrast. Angiographic images of the Circle of Willis were obtained using MRA technique without intravenous contrast. Angiographic images of the neck were obtained using MRA technique without and with intravenous contrast.  Carotid stenosis measurements (when applicable) are obtained utilizing NASCET criteria, using the distal internal carotid diameter as the denominator. CONTRAST:  34mL GADAVIST GADOBUTROL 1 MMOL/ML IV SOLN COMPARISON:  Prior CT from 09/10/2020. FINDINGS: MRI HEAD FINDINGS Brain: Cerebral volume within normal limits for age. Few scattered subcentimeter foci of FLAIR hyperintensity noted involving the supratentorial cerebral white matter, nonspecific, but felt to be within normal limits for age. There is a subtle punctate 4 mm focus of diffusion abnormality involving the cortex of the posterior left frontal lobe, left precentral gyrus (series 5, image 87). Given the patient's right-sided symptoms, finding is suspicious for a tiny acute ischemic infarct. Difficult to discern this finding on corresponding ADC map given small size. No associated hemorrhage or mass effect. No other foci of restricted diffusion to suggest acute or subacute ischemia. Gray-white matter differentiation otherwise maintained. No encephalomalacia to suggest chronic cortical infarction elsewhere within the brain. No foci of susceptibility artifact to suggest acute or chronic intracranial hemorrhage. No mass lesion, midline shift or mass effect. Ventricles normal size without hydrocephalus. No extra-axial fluid collection. Pituitary gland suprasellar region normal. Midline structures intact. Vascular: Major intracranial vascular flow voids are maintained. Skull and upper cervical spine: Craniocervical junction within normal limits. Bone marrow signal intensity normal. No scalp soft tissue abnormality. Sinuses/Orbits:  Globes and orbital soft tissues within normal limits. Tiny right maxillary sinus retention cyst noted. Right-to-left nasal septal deviation with associated concha bullosa. Moderate right with small left mastoid effusions. Visualized nasopharynx within normal limits. Inner ear structures normal. Other: None. MRA HEAD FINDINGS ANTERIOR  CIRCULATION: Visualized distal cervical segments of the internal carotid arteries are patent with symmetric antegrade flow. Petrous, cavernous, and supraclinoid segments patent without hemodynamically significant stenosis or other abnormality. A1 segments patent bilaterally. Normal anterior communicating artery complex. Anterior cerebral arteries patent to their distal aspects without stenosis. No M1 stenosis or occlusion. Normal MCA bifurcations. Distal MCA branches well perfused and symmetric. POSTERIOR CIRCULATION: Vertebral artery somewhat diminutive but are patent to the vertebrobasilar junction without stenosis. Right vertebral artery slightly dominant. Both PICA patent. Basilar diminutive but patent to its distal aspect without stenosis. Superior cerebral arteries patent bilaterally. 2-3 mm outpouching at the origin of the right SCA felt to be most consistent with a vascular infundibulum. Fetal type origin of the right PCA. Left PCA supplied via the basilar as well as a robust left posterior communicating artery. Both PCAs well perfused to their distal aspects without stenosis. No intracranial aneurysm. MRA NECK FINDINGS AORTIC ARCH: Visualized aortic arch of normal caliber with normal 3 vessel morphology. No hemodynamically significant stenosis seen about the origin of the great vessels. RIGHT CAROTID SYSTEM: Right CCA patent from its origin to the bifurcation without stenosis. No significant atheromatous narrowing seen about the right bifurcation. Right ICA widely patent distally without stenosis, evidence for dissection or occlusion. LEFT CAROTID SYSTEM: Left CCA patent from its origin to the bifurcation without stenosis. Short-segment atheromatous stenosis of up to approximately 40% seen at the origin of the left ICA (series 1098, image 1). Slight poststenotic dilatation. Left ICA patent distally to the skull base without stenosis, evidence for dissection, or occlusion. VERTEBRAL ARTERIES: Both vertebral  arteries arise from the subclavian arteries. No proximal subclavian artery stenosis. Right vertebral artery dominant with a diffusely hypoplastic left vertebral artery. Both vertebral arteries patent within the neck without stenosis, evidence for dissection or occlusion. IMPRESSION: MRI HEAD IMPRESSION: 1. Subtle punctate 4 mm focus of diffusion abnormality involving the posterior left frontal lobe, left precentral gyrus, suspicious for a tiny acute ischemic infarct. No associated hemorrhage or mass effect. 2. Otherwise normal brain MRI for age. MRA HEAD IMPRESSION: Negative intracranial MRA. No large vessel occlusion, hemodynamically significant stenosis, or other acute vascular abnormality. MRA NECK IMPRESSION: 1. 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 2. Otherwise wide patency of both carotid artery systems within the neck. 3. Wide patency of both vertebral arteries within the neck. Right vertebral artery slightly dominant. Electronically Signed   By: Jeannine Boga M.D.   On: 09/11/2020 02:19   MR BRAIN WO CONTRAST  Result Date: 09/11/2020 CLINICAL DATA:  Initial evaluation for acute facial droop, right hand numbness. EXAM: MRI HEAD WITHOUT CONTRAST MRA HEAD WITHOUT CONTRAST MRA NECK WITHOUT AND WITH CONTRAST TECHNIQUE: Multiplanar, multiecho pulse sequences of the brain and surrounding structures were obtained without intravenous contrast. Angiographic images of the Circle of Willis were obtained using MRA technique without intravenous contrast. Angiographic images of the neck were obtained using MRA technique without and with intravenous contrast. Carotid stenosis measurements (when applicable) are obtained utilizing NASCET criteria, using the distal internal carotid diameter as the denominator. CONTRAST:  54mL GADAVIST GADOBUTROL 1 MMOL/ML IV SOLN COMPARISON:  Prior CT from 09/10/2020. FINDINGS: MRI HEAD FINDINGS Brain: Cerebral volume within normal limits  for age.  Few scattered subcentimeter foci of FLAIR hyperintensity noted involving the supratentorial cerebral white matter, nonspecific, but felt to be within normal limits for age. There is a subtle punctate 4 mm focus of diffusion abnormality involving the cortex of the posterior left frontal lobe, left precentral gyrus (series 5, image 87). Given the patient's right-sided symptoms, finding is suspicious for a tiny acute ischemic infarct. Difficult to discern this finding on corresponding ADC map given small size. No associated hemorrhage or mass effect. No other foci of restricted diffusion to suggest acute or subacute ischemia. Gray-white matter differentiation otherwise maintained. No encephalomalacia to suggest chronic cortical infarction elsewhere within the brain. No foci of susceptibility artifact to suggest acute or chronic intracranial hemorrhage. No mass lesion, midline shift or mass effect. Ventricles normal size without hydrocephalus. No extra-axial fluid collection. Pituitary gland suprasellar region normal. Midline structures intact. Vascular: Major intracranial vascular flow voids are maintained. Skull and upper cervical spine: Craniocervical junction within normal limits. Bone marrow signal intensity normal. No scalp soft tissue abnormality. Sinuses/Orbits: Globes and orbital soft tissues within normal limits. Tiny right maxillary sinus retention cyst noted. Right-to-left nasal septal deviation with associated concha bullosa. Moderate right with small left mastoid effusions. Visualized nasopharynx within normal limits. Inner ear structures normal. Other: None. MRA HEAD FINDINGS ANTERIOR CIRCULATION: Visualized distal cervical segments of the internal carotid arteries are patent with symmetric antegrade flow. Petrous, cavernous, and supraclinoid segments patent without hemodynamically significant stenosis or other abnormality. A1 segments patent bilaterally. Normal anterior communicating artery complex.  Anterior cerebral arteries patent to their distal aspects without stenosis. No M1 stenosis or occlusion. Normal MCA bifurcations. Distal MCA branches well perfused and symmetric. POSTERIOR CIRCULATION: Vertebral artery somewhat diminutive but are patent to the vertebrobasilar junction without stenosis. Right vertebral artery slightly dominant. Both PICA patent. Basilar diminutive but patent to its distal aspect without stenosis. Superior cerebral arteries patent bilaterally. 2-3 mm outpouching at the origin of the right SCA felt to be most consistent with a vascular infundibulum. Fetal type origin of the right PCA. Left PCA supplied via the basilar as well as a robust left posterior communicating artery. Both PCAs well perfused to their distal aspects without stenosis. No intracranial aneurysm. MRA NECK FINDINGS AORTIC ARCH: Visualized aortic arch of normal caliber with normal 3 vessel morphology. No hemodynamically significant stenosis seen about the origin of the great vessels. RIGHT CAROTID SYSTEM: Right CCA patent from its origin to the bifurcation without stenosis. No significant atheromatous narrowing seen about the right bifurcation. Right ICA widely patent distally without stenosis, evidence for dissection or occlusion. LEFT CAROTID SYSTEM: Left CCA patent from its origin to the bifurcation without stenosis. Short-segment atheromatous stenosis of up to approximately 40% seen at the origin of the left ICA (series 1098, image 1). Slight poststenotic dilatation. Left ICA patent distally to the skull base without stenosis, evidence for dissection, or occlusion. VERTEBRAL ARTERIES: Both vertebral arteries arise from the subclavian arteries. No proximal subclavian artery stenosis. Right vertebral artery dominant with a diffusely hypoplastic left vertebral artery. Both vertebral arteries patent within the neck without stenosis, evidence for dissection or occlusion. IMPRESSION: MRI HEAD IMPRESSION: 1. Subtle  punctate 4 mm focus of diffusion abnormality involving the posterior left frontal lobe, left precentral gyrus, suspicious for a tiny acute ischemic infarct. No associated hemorrhage or mass effect. 2. Otherwise normal brain MRI for age. MRA HEAD IMPRESSION: Negative intracranial MRA. No large vessel occlusion, hemodynamically significant stenosis, or other acute vascular abnormality. MRA NECK IMPRESSION:  1. 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 2. Otherwise wide patency of both carotid artery systems within the neck. 3. Wide patency of both vertebral arteries within the neck. Right vertebral artery slightly dominant. Electronically Signed   By: Jeannine Boga M.D.   On: 09/11/2020 02:19   ECHOCARDIOGRAM COMPLETE  Result Date: 09/11/2020    ECHOCARDIOGRAM REPORT   Patient Name:   LLEWELLYN CHOPLIN Date of Exam: 09/11/2020 Medical Rec #:  [id number]     Height:       72.0 in Accession #:    [id number]    Weight:       240.0 lb Date of Birth:  12-25-1960    BSA:          2.302 m Patient Age:    33 years      BP:           134/87 mmHg Patient Gender: M             HR:           76 bpm. Exam Location:  Inpatient Procedure: 2D Echo, Cardiac Doppler and Color Doppler Indications:    TIA  History:        Patient has no prior history of Echocardiogram examinations.                 Risk Factors:Sleep Apnea, Hypertension and Dyslipidemia. GERD.  Sonographer:    Clayton Lefort RDCS (AE) Referring Phys: [id number] [place]  1. Left ventricular ejection fraction, by estimation, is 60 to 65%. The left ventricle has normal function. The left ventricle has no regional wall motion abnormalities. There is severe left ventricular hypertrophy. Left ventricular diastolic parameters  are consistent with Grade I diastolic dysfunction (impaired relaxation).  2. Right ventricular systolic function is normal. The right ventricular size is normal. Tricuspid regurgitation signal is inadequate  for assessing PA pressure.  3. The mitral valve is grossly normal. Trivial mitral valve regurgitation.  4. The aortic valve is tricuspid. Aortic valve regurgitation is not visualized.  5. Aortic dilatation noted. There is mild dilatation of the aortic root, measuring 40 mm.  6. The inferior vena cava is normal in size with greater than 50% respiratory variability, suggesting right atrial pressure of 3 mmHg. Comparison(s): No prior Echocardiogram. FINDINGS  Left Ventricle: Left ventricular ejection fraction, by estimation, is 60 to 65%. The left ventricle has normal function. The left ventricle has no regional wall motion abnormalities. The left ventricular internal cavity size was normal in size. There is  severe left ventricular hypertrophy. Left ventricular diastolic parameters are consistent with Grade I diastolic dysfunction (impaired relaxation). Indeterminate filling pressures. Right Ventricle: The right ventricular size is normal. No increase in right ventricular wall thickness. Right ventricular systolic function is normal. Tricuspid regurgitation signal is inadequate for assessing PA pressure. Left Atrium: Left atrial size was normal in size. Right Atrium: Right atrial size was normal in size. Pericardium: There is no evidence of pericardial effusion. Mitral Valve: The mitral valve is grossly normal. Trivial mitral valve regurgitation. Tricuspid Valve: The tricuspid valve is grossly normal. Tricuspid valve regurgitation is trivial. Aortic Valve: The aortic valve is tricuspid. Aortic valve regurgitation is not visualized. Aortic valve mean gradient measures 3.0 mmHg. Aortic valve peak gradient measures 4.8 mmHg. Aortic valve area, by VTI measures 2.87 cm. Pulmonic Valve: The pulmonic valve was grossly normal. Pulmonic valve regurgitation is not visualized. Aorta: Aortic dilatation noted. There is mild dilatation of  the aortic root, measuring 40 mm. Venous: The inferior vena cava is normal in size with  greater than 50% respiratory variability, suggesting right atrial pressure of 3 mmHg. IAS/Shunts: No atrial level shunt detected by color flow Doppler.  LEFT VENTRICLE PLAX 2D LVIDd:         3.80 cm  Diastology LVIDs:         2.70 cm  LV e' medial:    9.25 cm/s LV PW:         1.60 cm  LV E/e' medial:  8.4 LV IVS:        1.60 cm  LV e' lateral:   11.10 cm/s LVOT diam:     2.10 cm  LV E/e' lateral: 7.0 LV SV:         69 LV SV Index:   30 LVOT Area:     3.46 cm  RIGHT VENTRICLE          IVC RV Basal diam:  4.50 cm  IVC diam: 1.40 cm RV Mid diam:    3.20 cm TAPSE (M-mode): 2.9 cm LEFT ATRIUM             Index       RIGHT ATRIUM           Index LA diam:        3.60 cm 1.56 cm/m  RA Area:     18.70 cm LA Vol (A2C):   66.8 ml 29.02 ml/m RA Volume:   51.80 ml  22.50 ml/m LA Vol (A4C):   46.9 ml 20.37 ml/m LA Biplane Vol: 55.9 ml 24.28 ml/m  AORTIC VALVE AV Area (Vmax):    3.27 cm AV Area (Vmean):   2.68 cm AV Area (VTI):     2.87 cm AV Vmax:           110.00 cm/s AV Vmean:          79.300 cm/s AV VTI:            0.241 m AV Peak Grad:      4.8 mmHg AV Mean Grad:      3.0 mmHg LVOT Vmax:         104.00 cm/s LVOT Vmean:        61.400 cm/s LVOT VTI:          0.200 m LVOT/AV VTI ratio: 0.83  AORTA Ao Root diam: 4.00 cm Ao Asc diam:  3.40 cm MITRAL VALVE MV Area (PHT): 3.72 cm    SHUNTS MV Decel Time: 204 msec    Systemic VTI:  0.20 m MV E velocity: 78.00 cm/s  Systemic Diam: 2.10 cm MV A velocity: 63.80 cm/s MV E/A ratio:  1.22 Lyman Bishop MD Electronically signed by Lyman Bishop MD Signature Date/Time: 09/11/2020/9:57:23 AM    Final    VAS US CAROTID (at [facility] and [facility] only)  Result Date: 09/11/2020 Carotid Arterial Duplex Study Indications:       TIA. Risk Factors:      Hypertension, hyperlipidemia. Comparison Study:  no prior Performing Technologist: Abram Sander RVS  Examination Guidelines: A complete evaluation includes B-mode imaging, spectral Doppler, color Doppler, and power Doppler as needed of all  accessible portions of each vessel. Bilateral testing is considered an integral part of a complete examination. Limited examinations for reoccurring indications may be performed as noted.  Right Carotid Findings: +----------+--------+--------+--------+------------------+--------+           PSV cm/sEDV cm/sStenosisPlaque DescriptionComments +----------+--------+--------+--------+------------------+--------+ CCA Prox  93  21              heterogenous               +----------+--------+--------+--------+------------------+--------+ CCA Distal71      19              heterogenous               +----------+--------+--------+--------+------------------+--------+ ICA Prox  77      28      1-39%   heterogenous               +----------+--------+--------+--------+------------------+--------+ ICA Distal69      25                                         +----------+--------+--------+--------+------------------+--------+ ECA       170     29                                         +----------+--------+--------+--------+------------------+--------+ +----------+--------+-------+--------+-------------------+           PSV cm/sEDV cmsDescribeArm Pressure (mmHG) +----------+--------+-------+--------+-------------------+ Subclavian130                                        +----------+--------+-------+--------+-------------------+ +---------+--------+--+--------+--+---------+ VertebralPSV cm/s64EDV cm/s15Antegrade +---------+--------+--+--------+--+---------+  Left Carotid Findings: +----------+--------+--------+--------+------------------+--------+           PSV cm/sEDV cm/sStenosisPlaque DescriptionComments +----------+--------+--------+--------+------------------+--------+ CCA Prox  125     21              heterogenous               +----------+--------+--------+--------+------------------+--------+ CCA Distal87      21              heterogenous                +----------+--------+--------+--------+------------------+--------+ ICA Prox  86      26      1-39%   heterogenous               +----------+--------+--------+--------+------------------+--------+ ICA Distal92      34                                         +----------+--------+--------+--------+------------------+--------+ ECA       101     16                                         +----------+--------+--------+--------+------------------+--------+ +----------+--------+--------+--------+-------------------+           PSV cm/sEDV cm/sDescribeArm Pressure (mmHG) +----------+--------+--------+--------+-------------------+ [id number]                                          +----------+--------+--------+--------+-------------------+ +---------+--------+--+--------+--+---------+ VertebralPSV cm/s70EDV cm/s14Antegrade +---------+--------+--+--------+--+---------+   Summary: Right Carotid: Velocities in the right ICA are consistent with a 1-39% stenosis. Left Carotid: Velocities in the left ICA are consistent with a 1-39% stenosis. Vertebrals: Bilateral vertebral arteries demonstrate antegrade  flow. *See table(s) above for measurements and observations.  Electronically signed by Monica Martinez MD on 09/11/2020 at 4:50:48 PM.    Final    VAS [place] LOWER EXTREMITY VENOUS (DVT)  Result Date: 09/11/2020  Lower Venous DVT Study Indications: Stroke.  Comparison Study: no prior Performing Technologist: Abram Sander RVS  Examination Guidelines: A complete evaluation includes B-mode imaging, spectral Doppler, color Doppler, and power Doppler as needed of all accessible portions of each vessel. Bilateral testing is considered an integral part of a complete examination. Limited examinations for reoccurring indications may be performed as noted. The reflux portion of the exam is performed with the patient in reverse Trendelenburg.   +---------+---------------+---------+-----------+----------+--------------+ RIGHT    CompressibilityPhasicitySpontaneityPropertiesThrombus Aging +---------+---------------+---------+-----------+----------+--------------+ CFV      Full           Yes      Yes                                 +---------+---------------+---------+-----------+----------+--------------+ SFJ      Full                                                        +---------+---------------+---------+-----------+----------+--------------+ FV Prox  Full                                                        +---------+---------------+---------+-----------+----------+--------------+ FV Mid   Full                                                        +---------+---------------+---------+-----------+----------+--------------+ FV DistalFull                                                        +---------+---------------+---------+-----------+----------+--------------+ PFV      Full                                                        +---------+---------------+---------+-----------+----------+--------------+ POP      Full           Yes      Yes                                 +---------+---------------+---------+-----------+----------+--------------+ PTV      Full                                                        +---------+---------------+---------+-----------+----------+--------------+  PERO     Full                                                        +---------+---------------+---------+-----------+----------+--------------+   +---------+---------------+---------+-----------+----------+--------------+ LEFT     CompressibilityPhasicitySpontaneityPropertiesThrombus Aging +---------+---------------+---------+-----------+----------+--------------+ CFV      Full           Yes      Yes                                  +---------+---------------+---------+-----------+----------+--------------+ SFJ      Full                                                        +---------+---------------+---------+-----------+----------+--------------+ FV Prox  Full                                                        +---------+---------------+---------+-----------+----------+--------------+ FV Mid   Full                                                        +---------+---------------+---------+-----------+----------+--------------+ FV DistalFull                                                        +---------+---------------+---------+-----------+----------+--------------+ PFV      Full                                                        +---------+---------------+---------+-----------+----------+--------------+ POP      Full           Yes      Yes                                 +---------+---------------+---------+-----------+----------+--------------+ PTV      Full                                                        +---------+---------------+---------+-----------+----------+--------------+ PERO     Full                                                        +---------+---------------+---------+-----------+----------+--------------+  Summary: BILATERAL: - No evidence of deep vein thrombosis seen in the lower extremities, bilaterally. - No evidence of superficial venous thrombosis in the lower extremities, bilaterally. -No evidence of popliteal cyst, bilaterally.   *See table(s) above for measurements and observations. Electronically signed by Monica Martinez MD on 09/11/2020 at 4:51:57 PM.    Final      Labs: BNP (last 3 results) No results for input(s): BNP in the last 8760 hours. Basic Metabolic Panel: Recent Labs  Lab 09/10/20 1509 09/11/20 1409 09/12/20 0403  NA 138 138 137  K 4.3 3.9 4.2  CL 103 103 104  CO2 29 24 22   GLUCOSE 111* 156* 104*  BUN 29*  25* 28*  CREATININE 1.75* 1.80* 1.71*  CALCIUM 9.0 9.2 8.9   Liver Function Tests: Recent Labs  Lab 09/10/20 1509  AST 25  ALT 40  ALKPHOS 78  BILITOT 0.7  PROT 8.0  ALBUMIN 4.5   No results for input(s): LIPASE, AMYLASE in the last 168 hours. No results for input(s): AMMONIA in the last 168 hours. CBC: Recent Labs  Lab 09/10/20 1509  WBC 10.4  HGB 17.9*  HCT 52.4*  MCV 92.4  PLT 195   Cardiac Enzymes: No results for input(s): CKTOTAL, CKMB, CKMBINDEX, TROPONINI in the last 168 hours. BNP: Invalid input(s): POCBNP CBG: Recent Labs  Lab 09/10/20 1509  GLUCAP 113*   D-Dimer No results for input(s): DDIMER in the last 72 hours. Hgb A1c Recent Labs    09/11/20 0140 09/11/20 0500  HGBA1C 4.9 4.9   Lipid Profile Recent Labs    09/11/20 0140  CHOL 217*  HDL 41  LDLCALC 142*  TRIG 170*  CHOLHDL 5.3   Thyroid function studies No results for input(s): TSH, T4TOTAL, T3FREE, THYROIDAB in the last 72 hours.  Invalid input(s): FREET3 Anemia work up No results for input(s): VITAMINB12, FOLATE, FERRITIN, TIBC, IRON, RETICCTPCT in the last 72 hours. Urinalysis    Component Value Date/Time   COLORURINE COLORLESS (A) 09/10/2020 1607   APPEARANCEUR CLEAR 09/10/2020 1607   LABSPEC 1.005 09/10/2020 1607   PHURINE 5.0 09/10/2020 1607   GLUCOSEU NEGATIVE 09/10/2020 1607   HGBUR MODERATE (A) 09/10/2020 1607   BILIRUBINUR NEGATIVE 09/10/2020 1607   KETONESUR NEGATIVE 09/10/2020 1607   PROTEINUR 30 (A) 09/10/2020 1607   NITRITE NEGATIVE 09/10/2020 1607   LEUKOCYTESUR NEGATIVE 09/10/2020 1607   Sepsis Labs Invalid input(s): PROCALCITONIN,  WBC,  LACTICIDVEN Microbiology Recent Results (from the past 240 hour(s))  Resp Panel by RT-PCR (Flu A&B, Covid) Nasopharyngeal Swab     Status: None   Collection Time: 09/10/20  4:58 PM   Specimen: Nasopharyngeal Swab; Nasopharyngeal(NP) swabs in vial transport medium  Result Value Ref Range Status   SARS Coronavirus 2 by  RT PCR NEGATIVE NEGATIVE Final    Comment: (NOTE) SARS-CoV-2 target nucleic acids are NOT DETECTED.  The SARS-CoV-2 RNA is generally detectable in upper respiratory specimens during the acute phase of infection. The lowest concentration of SARS-CoV-2 viral copies this assay can detect is 138 copies/mL. A negative result does not preclude SARS-Cov-2 infection and should not be used as the sole basis for treatment or other patient management decisions. A negative result may occur with  improper specimen collection/handling, submission of specimen other than nasopharyngeal swab, presence of viral mutation(s) within the areas targeted by this assay, and inadequate number of viral copies(<138 copies/mL). A negative result must be combined with clinical observations, patient history, and epidemiological information. The expected result is Negative.  Fact Sheet for Patients:  EntrepreneurPulse.com.au  Fact Sheet for Healthcare Providers:  IncredibleEmployment.be  This test is no t yet approved or cleared by the [place] FDA and  has been authorized for detection and/or diagnosis of SARS-CoV-2 by FDA under an Emergency Use Authorization (EUA). This EUA will remain  in effect (meaning this test can be used) for the duration of the COVID-19 declaration under Section 564(b)(1) of the Act, 21 U.S.C.section 360bbb-3(b)(1), unless the authorization is terminated  or revoked sooner.       Influenza A by PCR NEGATIVE NEGATIVE Final   Influenza B by PCR NEGATIVE NEGATIVE Final    Comment: (NOTE) The Xpert Xpress SARS-CoV-2/FLU/RSV plus assay is intended as an aid in the diagnosis of influenza from Nasopharyngeal swab specimens and should not be used as a sole basis for treatment. Nasal washings and aspirates are unacceptable for Xpert Xpress SARS-CoV-2/FLU/RSV testing.  Fact Sheet for Patients: EntrepreneurPulse.com.au  Fact Sheet  for Healthcare Providers: IncredibleEmployment.be  This test is not yet approved or cleared by the [place] FDA and has been authorized for detection and/or diagnosis of SARS-CoV-2 by FDA under an Emergency Use Authorization (EUA). This EUA will remain in effect (meaning this test can be used) for the duration of the COVID-19 declaration under Section 564(b)(1) of the Act, 21 U.S.C. section 360bbb-3(b)(1), unless the authorization is terminated or revoked.  Performed at [facility], [place] [address], [place], [place] [address]      Time coordinating discharge: 35 minutes  SIGNED: Antonieta Pert, MD  [facility] 09/12/2020, 2:26 PM  If 7PM-7AM, please contact night-coverage [email or web address]

## 2020-09-12 NOTE — Consult Note (Addendum)
ELECTROPHYSIOLOGY CONSULT NOTE  Patient ID: Gabriel Lin MRN: [id number], DOB/AGE: [birth year]   Admit date: 09/10/2020 Date of Consult: 09/12/2020  Primary Physician: Galen Manila, MD Primary Cardiologist: none Reason for Consultation: Cryptogenic stroke ; recommendations regarding Implantable Loop Recorder, requested by Dr. Leonie Man  History of Present Illness Gabriel Lin was admitted on 09/10/2020 with stroke.    PMHx include HTN, HLD, smoker  Neurology notes;Stroke, left frontal lobe likely of cryptogenic etiology .  he has undergone workup for stroke including echocardiogram and carotid dopplers.  The patient has been monitored on telemetry which has demonstrated sinus rhythm with no arrhythmias   Dr. Leonie Man notes; Carotid ultrasound shows no significant extracranial stenosis.  Lower extremity venous Dopplers negative for DVT.  Transcranial Doppler bubble study was personally performed by me at the bedside and shows a few bubbles with slight increase after Valsalva maneuver but likely clinically insignificant right-to-left shunt.  Transesophageal echocardiogram performed earlier today shows no evidence of intra-atrial clot or PFO but did show presence of intra-atrial bubble suggestive of small right-to-left shunt Likely trivial Requests evaluation for loop implant   TEE done today LEFT VENTRICLE: EF = 60-65%. No regional wall motion abnormalities.  RIGHT VENTRICLE: Normal size and function.   LEFT ATRIUM: No thrombus/mass.  LEFT ATRIAL APPENDAGE: No thrombus/mass.   RIGHT ATRIUM: No thrombus/mass.  AORTIC VALVE:  Trileaflet. No regurgitation. No vegetation.  MITRAL VALVE:    Normal structure. Trivial regurgitation. No vegetation.  TRICUSPID VALVE: Normal structure. Trivial regurgitation. No vegetation.  PULMONIC VALVE: Grossly normal structure. Trivial regurgitation. No apparent vegetation.  INTERATRIAL SEPTUM: No PFO or ASD seen by color Doppler.  However, bubble study showed right to left shunting (initial two beats not captured on echo but rapid bubbles seen in left atrium consistent with intra-atrial shunt).  PERICARDIUM: Small focal posterior effusion noted.  DESCENDING AORTA: Mild diffuse plaque seen  CONCLUSION: While no ASD/PFO seen with Doppler, the bubble study is positive for right to left shunting, consistent with intra-atrial shunt. No other cardiac source of embolism noted.   TCD 09/12/20 Summary:  A vascular evaluation was performed. The right middle cerebral artery was  studied. An IV was inserted into the patient's left forearm. Verbal  informed consent was obtained.    Few HITS heard with valsalva maneuver, suggestive of small, likely  clinically insignificant PFO.   Lab work is reviewed.   Prior to admission, the patient denies chest pain, shortness of breath, dizziness, or syncope.   He does mention maybe every few/several months when he drinks a lot of caffeine he will get a couple palpitations, these are momentary  They are recovering from their stroke with plans to home at discharge     Past Medical History:  Diagnosis Date  . Cataract    "early"  . GERD (gastroesophageal reflux disease)   . Hyperlipidemia    no meds taken  . Hypertension   . Sleep apnea    wears CPA     Surgical History:  Past Surgical History:  Procedure Laterality Date  . COLONOSCOPY    . HERNIA REPAIR     double hernia repair- 2005 ?  Marland Kitchen LAMINECTOMY       Medications Prior to Admission  Medication Sig Dispense Refill Last Dose  . atenolol (TENORMIN) 50 MG tablet Take 50 mg by mouth daily.    09/10/2020 at 0600  . ergocalciferol (VITAMIN D2) 1.25 MG (50000 UT) capsule Take 50,000 Units by mouth every 7 (seven) days.  Past Week at Unknown time  . esomeprazole (NEXIUM) 40 MG capsule Take 1 tablet by mouth daily.   09/10/2020 at Unknown time  . melatonin 3 MG TABS tablet Take 6 mg by mouth at bedtime as needed  (For sleep).   09/09/2020 at Unknown time    Inpatient Medications:  .  stroke: mapping our early stages of recovery book   Does not apply Once  . aspirin EC  81 mg Oral Daily  . atenolol  50 mg Oral Daily  . clopidogrel  75 mg Oral Daily  . pantoprazole  40 mg Oral Daily    Allergies: No Known Allergies  Social History   Socioeconomic History  . Marital status: Single    Spouse name: Not on file  . Number of children: Not on file  . Years of education: Not on file  . Highest education level: Not on file  Occupational History  . Not on file  Tobacco Use  . Smoking status: Former Research scientist (life sciences)  . Smokeless tobacco: Never Used  . Tobacco comment: 2012 quit  Vaping Use  . Vaping Use: Some days  Substance and Sexual Activity  . Alcohol use: Not Currently  . Drug use: Never  . Sexual activity: Not on file  Other Topics Concern  . Not on file  Social History Narrative  . Not on file   Social Determinants of Health   Financial Resource Strain:   . Difficulty of Paying Living Expenses: Not on file  Food Insecurity:   . Worried About Charity fundraiser in the Last Year: Not on file  . Ran Out of Food in the Last Year: Not on file  Transportation Needs:   . Lack of Transportation (Medical): Not on file  . Lack of Transportation (Non-Medical): Not on file  Physical Activity:   . Days of Exercise per Week: Not on file  . Minutes of Exercise per Session: Not on file  Stress:   . Feeling of Stress : Not on file  Social Connections:   . Frequency of Communication with Friends and Family: Not on file  . Frequency of Social Gatherings with Friends and Family: Not on file  . Attends Religious Services: Not on file  . Active Member of Clubs or Organizations: Not on file  . Attends Archivist Meetings: Not on file  . Marital Status: Not on file  Intimate Partner Violence:   . Fear of Current or Ex-Partner: Not on file  . Emotionally Abused: Not on file  . Physically  Abused: Not on file  . Sexually Abused: Not on file     Family History  Problem Relation Age of Onset  . Colon cancer Brother 77  . Colon polyps Brother   . Esophageal cancer Neg Hx   . Rectal cancer Neg Hx   . Stomach cancer Neg Hx       Review of Systems: All other systems reviewed and are otherwise negative except as noted above.  Physical Exam: Vitals:   09/12/20 0955 09/12/20 1000 09/12/20 1005 09/12/20 1142  BP: (!) 142/90 (!) 132/108 125/72 (!) 142/83  Pulse: 88 85 85 85  Resp: 17 13 (!) 21 16  Temp:    98 F (36.7 C)  TempSrc:    Oral  SpO2: 97% 96% 95% 99%  Weight:      Height:        GEN- The patient is well appearing, alert and oriented x 3 today.   Head-  normocephalic, atraumatic Eyes-  Sclera clear, conjunctiva pink Ears- hearing intact Oropharynx- clear Neck- supple Lungs-CTA, normal work of breathing Heart- RRR, no murmurs, rubs or gallops  GI- soft, NT, ND Extremities- no clubbing, cyanosis, or edema MS- no significant deformity or atrophy Skin- no rash or lesion Psych- euthymic mood, full affect   Labs:   Lab Results  Component Value Date   WBC 10.4 09/10/2020   HGB 17.9 (H) 09/10/2020   HCT 52.4 (H) 09/10/2020   MCV 92.4 09/10/2020   PLT 195 09/10/2020    Recent Labs  Lab 09/10/20 1509 09/11/20 1409 09/12/20 0403  NA 138   < > 137  K 4.3   < > 4.2  CL 103   < > 104  CO2 29   < > 22  BUN 29*   < > 28*  CREATININE 1.75*   < > 1.71*  CALCIUM 9.0   < > 8.9  PROT 8.0  --   --   BILITOT 0.7  --   --   ALKPHOS 78  --   --   ALT 40  --   --   AST 25  --   --   GLUCOSE 111*   < > 104*   < > = values in this interval not displayed.   No results found for: CKTOTAL, CKMB, CKMBINDEX, TROPONINI Lab Results  Component Value Date   CHOL 217 (H) 09/11/2020   Lab Results  Component Value Date   HDL 41 09/11/2020   Lab Results  Component Value Date   LDLCALC 142 (H) 09/11/2020   Lab Results  Component Value Date   TRIG 170 (H)  09/11/2020   Lab Results  Component Value Date   CHOLHDL 5.3 09/11/2020   No results found for: LDLDIRECT  No results found for: DDIMER   Radiology/Studies:   CT HEAD WO CONTRAST Result Date: 09/10/2020 CLINICAL DATA:  Right-sided facial droop with headache and numbness beginning last night. Symptoms resolving. EXAM: CT HEAD WITHOUT CONTRAST TECHNIQUE: Contiguous axial images were obtained from the base of the skull through the vertex without intravenous contrast. COMPARISON:  None. FINDINGS: Brain: Ventricles, cisterns and other CSF spaces are normal. There is no mass, mass effect, shift of midline structures or acute hemorrhage. No evidence of acute infarction. Vascular: No hyperdense vessel or unexpected calcification. Skull: Normal. Negative for fracture or focal lesion. Sinuses/Orbits: No acute finding. Other: None. IMPRESSION: No acute findings. Electronically Signed   By: Marin Olp M.D.   On: 09/10/2020 15:59    MR ANGIO HEAD WO CONTRAST Result Date: 09/11/2020 CLINICAL DATA:  Initial evaluation for acute facial droop, right hand numbness. EXAM: MRI HEAD WITHOUT CONTRAST MRA HEAD WITHOUT CONTRAST MRA NECK WITHOUT AND WITH CONTRAST TECHNIQUE: Multiplanar, multiecho pulse sequences of the brain and surrounding structures were obtained without intravenous contrast. Angiographic images of the Circle of Willis were obtained using MRA technique without intravenous contrast. Angiographic images of the neck were obtained using MRA technique without and with intravenous contrast. Carotid stenosis measurements (when applicable) are obtained utilizing NASCET criteria, using the distal internal carotid diameter as the denominator. CONTRAST:  33mL GADAVIST GADOBUTROL 1 MMOL/ML IV SOLN COMPARISON:  Prior CT from 09/10/2020. FINDINGS: MRI HEAD FINDINGS Brain: Cerebral volume within normal limits for age. Few scattered subcentimeter foci of FLAIR hyperintensity noted involving the supratentorial  cerebral white matter, nonspecific, but felt to be within normal limits for age. There is a subtle punctate 4 mm focus of diffusion  abnormality involving the cortex of the posterior left frontal lobe, left precentral gyrus (series 5, image 87). Given the patient's right-sided symptoms, finding is suspicious for a tiny acute ischemic infarct. Difficult to discern this finding on corresponding ADC map given small size. No associated hemorrhage or mass effect. No other foci of restricted diffusion to suggest acute or subacute ischemia. Gray-white matter differentiation otherwise maintained. No encephalomalacia to suggest chronic cortical infarction elsewhere within the brain. No foci of susceptibility artifact to suggest acute or chronic intracranial hemorrhage. No mass lesion, midline shift or mass effect. Ventricles normal size without hydrocephalus. No extra-axial fluid collection. Pituitary gland suprasellar region normal. Midline structures intact. Vascular: Major intracranial vascular flow voids are maintained. Skull and upper cervical spine: Craniocervical junction within normal limits. Bone marrow signal intensity normal. No scalp soft tissue abnormality. Sinuses/Orbits: Globes and orbital soft tissues within normal limits. Tiny right maxillary sinus retention cyst noted. Right-to-left nasal septal deviation with associated concha bullosa. Moderate right with small left mastoid effusions. Visualized nasopharynx within normal limits. Inner ear structures normal. Other: None. MRA HEAD FINDINGS ANTERIOR CIRCULATION: Visualized distal cervical segments of the internal carotid arteries are patent with symmetric antegrade flow. Petrous, cavernous, and supraclinoid segments patent without hemodynamically significant stenosis or other abnormality. A1 segments patent bilaterally. Normal anterior communicating artery complex. Anterior cerebral arteries patent to their distal aspects without stenosis. No M1 stenosis or  occlusion. Normal MCA bifurcations. Distal MCA branches well perfused and symmetric. POSTERIOR CIRCULATION: Vertebral artery somewhat diminutive but are patent to the vertebrobasilar junction without stenosis. Right vertebral artery slightly dominant. Both PICA patent. Basilar diminutive but patent to its distal aspect without stenosis. Superior cerebral arteries patent bilaterally. 2-3 mm outpouching at the origin of the right SCA felt to be most consistent with a vascular infundibulum. Fetal type origin of the right PCA. Left PCA supplied via the basilar as well as a robust left posterior communicating artery. Both PCAs well perfused to their distal aspects without stenosis. No intracranial aneurysm. MRA NECK FINDINGS AORTIC ARCH: Visualized aortic arch of normal caliber with normal 3 vessel morphology. No hemodynamically significant stenosis seen about the origin of the great vessels. RIGHT CAROTID SYSTEM: Right CCA patent from its origin to the bifurcation without stenosis. No significant atheromatous narrowing seen about the right bifurcation. Right ICA widely patent distally without stenosis, evidence for dissection or occlusion. LEFT CAROTID SYSTEM: Left CCA patent from its origin to the bifurcation without stenosis. Short-segment atheromatous stenosis of up to approximately 40% seen at the origin of the left ICA (series 1098, image 1). Slight poststenotic dilatation. Left ICA patent distally to the skull base without stenosis, evidence for dissection, or occlusion. VERTEBRAL ARTERIES: Both vertebral arteries arise from the subclavian arteries. No proximal subclavian artery stenosis. Right vertebral artery dominant with a diffusely hypoplastic left vertebral artery. Both vertebral arteries patent within the neck without stenosis, evidence for dissection or occlusion. IMPRESSION: MRI HEAD IMPRESSION: 1. Subtle punctate 4 mm focus of diffusion abnormality involving the posterior left frontal lobe, left  precentral gyrus, suspicious for a tiny acute ischemic infarct. No associated hemorrhage or mass effect. 2. Otherwise normal brain MRI for age. MRA HEAD IMPRESSION: Negative intracranial MRA. No large vessel occlusion, hemodynamically significant stenosis, or other acute vascular abnormality. MRA NECK IMPRESSION: 1. 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 2. Otherwise wide patency of both carotid artery systems within the neck. 3. Wide patency of both vertebral arteries within the neck. Right vertebral artery  slightly dominant. Electronically Signed   By: Jeannine Boga M.D.   On: 09/11/2020 02:19     VAS [place] TRANSCRANIAL DOPPLER W BUBBLES Result Date: 09/12/2020  Transcranial Doppler with Bubble Indications: Stroke. History: Hypertension, hyperlipidemia. Comparison Study: No prior study Performing Technologist: Maudry Mayhew MHA, RDMS, RVT, RDCS  Examination Guidelines: A complete evaluation includes B-mode imaging, spectral Doppler, color Doppler, and power Doppler as needed of all accessible portions of each vessel. Bilateral testing is considered an integral part of a complete examination. Limited examinations for reoccurring indications may be performed as noted.  Summary:  A vascular evaluation was performed. The right middle cerebral artery was studied. An IV was inserted into the patient's left forearm. Verbal informed consent was obtained.  Few HITS heard with valsalva maneuver, suggestive of small, likely clinically insignificant PFO.  *See table(s) above for TCD measurements and observations.    Preliminary      VAS US CAROTID (at [facility] and [facility] only) Result Date: 09/11/2020 Carotid Arterial Duplex Study Indications:       TIA. Risk Factors:      Hypertension, hyperlipidemia. Comparison Study:  no prior Performing Technologist: Abram Sander RVS  Examination Guidelines: A complete evaluation includes B-mode imaging, spectral Doppler, color Doppler, and power  Doppler as needed of all accessible portions of each vessel. Bilateral testing is considered an integral part of a complete examination. Limited examinations for reoccurring indications may be performed as noted.  Right Carotid Findings  Summary: Right Carotid: Velocities in the right ICA are consistent with a 1-39% stenosis. Left Carotid: Velocities in the left ICA are consistent with a 1-39% stenosis. Vertebrals: Bilateral vertebral arteries demonstrate antegrade flow. *See table(s) above for measurements and observations.  Electronically signed by Monica Martinez MD on 09/11/2020 at 4:50:48 PM.    Final      VAS [place] LOWER EXTREMITY VENOUS (DVT) Result Date: 09/11/2020  Lower Venous DVT Study Indications: Stroke.  Comparison Study: no prior Performing Technologist: Abram Sander RVS  Examination Guidelines: A complete evaluation includes B-mode imaging, spectral Doppler, color Doppler, and power Doppler as needed of all accessible portions of each vessel. Bilateral testing is considered an integral part of a complete examination. Limited examinations for reoccurring indications may be performed as noted. The reflux portion of the exam is performed with the patient in reverse Trendelenburg.    Summary: BILATERAL: - No evidence of deep vein thrombosis seen in the lower extremities, bilaterally. - No evidence of superficial venous thrombosis in the lower extremities, bilaterally. -No evidence of popliteal cyst, bilaterally.   *See table(s) above for measurements and observations. Electronically signed by Monica Martinez MD on 09/11/2020 at 4:51:57 PM.    Final     12-lead ECG SR All prior EKG's in EPIC reviewed with no documented atrial fibrillation  Telemetry SR  Assessment and Plan:  1. Cryptogenic stroke The patient presents with cryptogenic stroke.   The patient had a TEE earlier today with sedation. He is AAOx4, he is able to tell me the conversation he had with Dr. Leonie Man about loop  ,onitor and why.  I spoke at length with the patient about monitoring for afib with either a 30 day event monitor or an implantable loop recorder.  Risks, benefits, and alteratives to implantable loop recorder were discussed with the patient today.    While I am with him Dr. Leonie Man called the patient, mentions that he had a cardiologist review his TEE and feels he has a small PFO and was going  to refer him to discuss it outpatient. I spoke with Dr. Leonie Man, he feels given the very small amount of bubbles noted and other testing, very small PFO he still would like AFib surveillance with loop monitoring implant. The patient was able to repeat back to me and his wife the above and states clear understanding and theywould like to proceed with loop  Wound care was reviewed with the patient (keep incision clean and dry for 3 days).  Wound check will be scheduled for the patient  Please call with questions.   Baldwin [place], PA-C 09/12/2020   I have seen, examined the patient, and reviewed the above assessment and plan.  Changes to above are made where necessary.  On exam, RRR.  The patient has had an embolic stroke of unknown source.  I agree with Dr Leonie Man that long term monitoring is indicated to evaluate for afib as a possible cause. Risks and benefits to ILR implantation were discussed with the patient who wishes to proceed.  Co Sign: [name], MD 09/12/2020 4:15 PM

## 2020-09-13 ENCOUNTER — Encounter (HOSPITAL_COMMUNITY): Payer: Self-pay | Admitting: Internal Medicine

## 2020-09-13 ENCOUNTER — Telehealth: Payer: Self-pay

## 2020-09-13 NOTE — Telephone Encounter (Signed)
Scheduled the patient for PFO consult with Dr. Burt Knack 12/15.  The patient was grateful for call and agrees with plan.

## 2020-09-19 ENCOUNTER — Other Ambulatory Visit: Payer: Self-pay

## 2020-09-19 ENCOUNTER — Ambulatory Visit (INDEPENDENT_AMBULATORY_CARE_PROVIDER_SITE_OTHER): Payer: BC Managed Care – PPO | Admitting: Cardiovascular Disease

## 2020-09-19 VITALS — BP 140/82 | HR 58 | Ht 72.0 in | Wt 246.0 lb

## 2020-09-19 DIAGNOSIS — Q211 Atrial septal defect: Secondary | ICD-10-CM

## 2020-09-19 DIAGNOSIS — Q2112 Patent foramen ovale: Secondary | ICD-10-CM

## 2020-09-19 NOTE — Progress Notes (Signed)
[facility]   STRUCTURAL HEART TEAM  Date:  09/20/2020   ID:  Gabriel Lin, DOB 12/27/1960, MRN [id number]  PCP:  Gabriel Manila, MD   Chief Complaint  Patient presents with  . PFO     HISTORY OF PRESENT ILLNESS: Gabriel Lin is a 59 y.o. male who presents for evaluation of PFO, referred by Dr Gabriel Lin.  The patient has a history of hypertension, mixed hyperlipidemia, obstructive sleep apnea on CPAP, nonalcoholic steatohepatitis (NASH), and gastroesophageal reflux disease.  He was in his normal state of health when he presented to [facility] with symptoms of right arm weakness and numbness as well as expressive aphasia.  He had drooping of his mouth, unintelligible speech, and symptoms lasted for only a short duration.  He eventually went to the emergency room for evaluation.  He denied any associated headache, vision changes, or hearing changes.  His symptoms had completely resolved by the time he was seen in the emergency department.  Work-up demonstrated a probable ischemic infarct in the left frontal lobe on MRI.  MRA of the head was unremarkable.  MRA of the neck showed a 40% left internal carotid artery stenosis but was otherwise unremarkable.  2D echo was essentially normal with normal left atrial size, normal LV function, no significant valvular disease.  A transesophageal echo was performed.  This demonstrated the presence of a PFO without associated atrial septal aneurysm.  Transcranial Doppler study demonstrated findings consistent with a small PFO.  Because of the patient's age and comorbid medical conditions, an EP consult was obtained for an implantable loop recorder which was placed on September 12, 2020 to monitor for atrial fibrillation.  The patient now presents for outpatient PFO consultation.  He is doing well since hospital discharge.  He has changed his lifestyle and started eating much healthier.  He denies any recurrent neurologic symptoms.  He is  compliant with CPAP.  He denies chest pain, shortness of breath, or heart palpitations.  Past Medical History:  Diagnosis Date  . Cataract    "early"  . GERD (gastroesophageal reflux disease)   . Hyperlipidemia    no meds taken  . Hypertension   . Sleep apnea    wears CPA    Current Outpatient Medications  Medication Sig Dispense Refill  . aspirin EC 81 MG EC tablet Take 1 tablet (81 mg total) by mouth daily. Swallow whole. 30 tablet 1  . atenolol (TENORMIN) 50 MG tablet Take 50 mg by mouth daily.     . clopidogrel (PLAVIX) 75 MG tablet Take 1 tablet (75 mg total) by mouth daily for 20 days. 20 tablet 0  . ergocalciferol (VITAMIN D2) 1.25 MG (50000 UT) capsule Take 50,000 Units by mouth every 7 (seven) days.     . melatonin 3 MG TABS tablet Take 6 mg by mouth at bedtime as needed (For sleep).    Marland Kitchen esomeprazole (NEXIUM) 40 MG capsule Take 1 tablet by mouth daily.     No current facility-administered medications for this visit.    ALLERGIES:   Patient has no known allergies.   SOCIAL HISTORY:  The patient  reports that he has quit smoking. He has never used smokeless tobacco. He reports previous alcohol use. He reports that he does not use drugs.   FAMILY HISTORY:  The patient's family history includes Colon cancer (age of onset: 64) in his brother; Colon polyps in his brother.   REVIEW OF SYSTEMS:  All other systems are  reviewed and negative.   PHYSICAL EXAM: VS:  BP 140/82   Pulse (!) 58   Ht 6' (1.829 m)   Wt 246 lb (111.6 kg)   SpO2 98%   BMI 33.36 kg/m  , BMI Body mass index is 33.36 kg/m. GEN: Well nourished, well developed, in no acute distress HEENT: normal Neck: No JVD. carotids 2+ without bruits or masses Cardiac: The heart is RRR without murmurs, rubs, or gallops. No edema. Pedal pulses 2+ = bilaterally  Respiratory:  clear to auscultation bilaterally GI: soft, nontender, nondistended, + BS MS: no deformity or atrophy Skin: warm and dry, no rash Neuro:   Strength and sensation are intact Psych: euthymic mood, full affect  RECENT LABS: 09/10/2020: ALT 40; Hemoglobin 17.9; Platelets 195 09/12/2020: BUN 28; Creatinine, Ser 1.71; Potassium 4.2; Sodium 137  09/11/2020: Cholesterol 217; HDL 41; LDL Cholesterol 142; Total CHOL/HDL Ratio 5.3; Triglycerides 170; VLDL 34   Estimated Creatinine Clearance: 58.5 mL/min (A) (by C-G formula based on SCr of 1.71 mg/dL (H)).   Wt Readings from Last 3 Encounters:  09/19/20 246 lb (111.6 kg)  09/10/20 240 lb (108.9 kg)  06/23/20 241 lb 6.4 oz (109.5 kg)     PERTINENT STUDIES:  Transcranial Doppler:  A vascular evaluation was performed. The right middle cerebral artery was  studied. An IV was inserted into the patient's left forearm. Verbal  informed consent was obtained.    Few HITS heard with valsalva maneuver, suggestive of small PFO.   Positive TCD Bubble study at rest and valasalva indicative of a small  right to left shunt    TEE:  IMPRESSIONS    1. Left ventricular ejection fraction, by estimation, is 60 to 65%. The  left ventricle has normal function.  2. Right ventricular systolic function is normal. The right ventricular  size is normal.  3. No left atrial/left atrial appendage thrombus was detected.  4. A small pericardial effusion is present.  5. The mitral valve is normal in structure. Trivial mitral valve  regurgitation.  6. The aortic valve is tricuspid. Aortic valve regurgitation is not  visualized. No aortic stenosis is present.  7. There is mild (Grade II) plaque involving the descending aorta.  8. Evidence of atrial level shunting detected by color flow Doppler.  Agitated saline contrast bubble study was positive with shunting observed  within 3-6 cardiac cycles suggestive of interatrial shunt. There is a  possible patent foramen ovale.   Conclusion(s)/Recommendation(s): Cannot exclude PFO. While no definitive  channel seen, there are flashes of color across  the intra-atrial septum  suggestive of PFO. There is also positive right to left shunting seen by  bubble study.   MRI Brain 09/11/2020: IMPRESSION: MRI HEAD IMPRESSION:  1. Subtle punctate 4 mm focus of diffusion abnormality involving the posterior left frontal lobe, left precentral gyrus, suspicious for a tiny acute ischemic infarct. No associated hemorrhage or mass effect. 2. Otherwise normal brain MRI for age.  MRA HEAD IMPRESSION:  Negative intracranial MRA. No large vessel occlusion, hemodynamically significant stenosis, or other acute vascular abnormality.  MRA NECK IMPRESSION:  1. 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 2. Otherwise wide patency of both carotid artery systems within the neck. 3. Wide patency of both vertebral arteries within the neck. Right vertebral artery slightly dominant.  ASSESSMENT AND PLAN: 1.  PFO: Radiographic results, hospital notes, lab results, and cardiac imaging data are reviewed. TEE images are reviewed and demonstrate normal left ventricular function, normal  right ventricular function, no significant valvular disease, and the presence of a PFO located in the superior aspect of the septum with color flow demonstrated through the PFO tunnel.  There is no associated atrial septal aneurysm.   The patient's Rope Score is 4, indicating a 38% probability that the patient's stroke is 'PFO-related.'  The patient is counseled about the association of PFO and cryptogenic stroke. Available clinical trial data is reviewed, specifically those trials comparing transcatheter PFO closure and medical therapy with antiplatelet drugs. The patient understands the potential benefit of PFO closure with respect to secondary stroke reduction compared with medical therapy alone.  The patient has appropriately undergone implantable loop monitoring for the presence of atrial fibrillation in the setting of his risk factors for atrial  fibrillation (age, obstructive sleep apnea, hypertension, obesity).  His PFO is clearly present, but is without high risk features of large shunt or associated atrial septal aneurysm.  Considering his age, comorbid medical conditions, and low risk anatomic features, I would reserve transcatheter PFO closure for recurrent events.  I think ongoing medical therapy and surveillance for atrial fibrillation are appropriate.  Lifestyle modification is reviewed with the patient.  I would be happy to see him back at any time if he has a recurrent stroke or TIA on medical therapy.  His PFO would be anatomically suitable for transcatheter closure if felt to be appropriate in the future.  Sherren Mocha 09/20/2020 6:33 AM

## 2020-09-19 NOTE — Patient Instructions (Signed)
Your provider recommends that you schedule a follow-up appointment AS NEEDED! It was nice to meet you!

## 2020-09-20 ENCOUNTER — Encounter: Payer: Self-pay | Admitting: Cardiovascular Disease

## 2020-09-21 NOTE — Anesthesia Postprocedure Evaluation (Signed)
Anesthesia Post Note  Patient: Gabriel Lin  Procedure(s) Performed: TRANSESOPHAGEAL ECHOCARDIOGRAM (TEE) (N/A ) BUBBLE STUDY     Patient location during evaluation: Endoscopy Anesthesia Type: MAC Level of consciousness: awake and alert Pain management: pain level controlled Vital Signs Assessment: post-procedure vital signs reviewed and stable Respiratory status: spontaneous breathing, nonlabored ventilation, respiratory function stable and patient connected to nasal cannula oxygen Cardiovascular status: stable and blood pressure returned to baseline Postop Assessment: no apparent nausea or vomiting Anesthetic complications: no   No complications documented.  Last Vitals:  Vitals:   09/12/20 1005 09/12/20 1142  BP: 125/72 (!) 142/83  Pulse: 85 85  Resp: (!) 21 16  Temp:  36.7 C  SpO2: 95% 99%    Last Pain:  Vitals:   09/12/20 1142  TempSrc: Oral  PainSc:                  Shreena Baines

## 2020-09-21 NOTE — Progress Notes (Signed)
Thanks

## 2020-09-21 NOTE — Anesthesia Preprocedure Evaluation (Signed)
Anesthesia Evaluation  Patient identified by MRN, date of birth, ID band Patient awake    Reviewed: Allergy & Precautions, NPO status , Patient's Chart, lab work & pertinent test results  History of Anesthesia Complications Negative for: history of anesthetic complications  Airway Mallampati: II  TM Distance: >3 FB Neck ROM: Full    Dental  (+) Dental Advisory Given   Pulmonary neg shortness of breath, sleep apnea , neg COPD, neg recent URI, former smoker,  Covid-19 Nucleic Acid Test Results Lab Results      Component                Value               Date                      SARSCOV2NAA              NEGATIVE            09/10/2020              breath sounds clear to auscultation       Cardiovascular hypertension, Pt. on medications  Rhythm:Regular  1. Left ventricular ejection fraction, by estimation, is 60 to 65%. The  left ventricle has normal function. The left ventricle has no regional  wall motion abnormalities. There is severe left ventricular hypertrophy.  Left ventricular diastolic parameters  are consistent with Grade I diastolic dysfunction (impaired relaxation).  2. Right ventricular systolic function is normal. The right ventricular  size is normal. Tricuspid regurgitation signal is inadequate for assessing  PA pressure.  3. The mitral valve is grossly normal. Trivial mitral valve  regurgitation.  4. The aortic valve is tricuspid. Aortic valve regurgitation is not  visualized.  5. Aortic dilatation noted. There is mild dilatation of the aortic root,  measuring 40 mm.  6. The inferior vena cava is normal in size with greater than 50%  respiratory variability, suggesting right atrial pressure of 3 mmHg.    Neuro/Psych neg Seizures TIACVA negative psych ROS   GI/Hepatic Neg liver ROS, GERD  Medicated and Controlled,  Endo/Other    Renal/GU Renal diseaseLab Results      Component                Value                Date                      CREATININE               1.71 (H)            09/12/2020                Musculoskeletal   Abdominal   Peds  Hematology Lab Results      Component                Value               Date                      WBC                      10.4                09/10/2020  HGB                      17.9 (H)            09/10/2020                HCT                      52.4 (H)            09/10/2020                MCV                      92.4                09/10/2020                PLT                      195                 09/10/2020              Anesthesia Other Findings   Reproductive/Obstetrics                             Anesthesia Physical Anesthesia Plan  ASA: III  Anesthesia Plan: MAC   Post-op Pain Management:    Induction: Intravenous  PONV Risk Score and Plan: 1 and Propofol infusion and Treatment may vary due to age or medical condition  Airway Management Planned: Nasal Cannula  Additional Equipment: None  Intra-op Plan:   Post-operative Plan:   Informed Consent: I have reviewed the patients History and Physical, chart, labs and discussed the procedure including the risks, benefits and alternatives for the proposed anesthesia with the patient or authorized representative who has indicated his/her understanding and acceptance.     Dental advisory given  Plan Discussed with: CRNA and Surgeon  Anesthesia Plan Comments:         Anesthesia Quick Evaluation

## 2020-09-24 ENCOUNTER — Other Ambulatory Visit: Payer: Self-pay

## 2020-09-24 ENCOUNTER — Emergency Department (HOSPITAL_COMMUNITY)
Admission: EM | Admit: 2020-09-24 | Discharge: 2020-09-24 | Disposition: A | Discharge status: Home / Self Care | Payer: BC Managed Care – PPO | Attending: Emergency Medicine | Admitting: Emergency Medicine

## 2020-09-24 ENCOUNTER — Emergency Department (HOSPITAL_COMMUNITY): Payer: BC Managed Care – PPO

## 2020-09-24 ENCOUNTER — Encounter (HOSPITAL_COMMUNITY): Payer: Self-pay | Admitting: Emergency Medicine

## 2020-09-24 DIAGNOSIS — R29810 Facial weakness: Secondary | ICD-10-CM | POA: Diagnosis present

## 2020-09-24 DIAGNOSIS — I639 Cerebral infarction, unspecified: Secondary | ICD-10-CM | POA: Diagnosis not present

## 2020-09-24 DIAGNOSIS — I10 Essential (primary) hypertension: Secondary | ICD-10-CM | POA: Insufficient documentation

## 2020-09-24 DIAGNOSIS — Z79899 Other long term (current) drug therapy: Secondary | ICD-10-CM | POA: Diagnosis not present

## 2020-09-24 DIAGNOSIS — Z7982 Long term (current) use of aspirin: Secondary | ICD-10-CM | POA: Diagnosis not present

## 2020-09-24 DIAGNOSIS — Z87891 Personal history of nicotine dependence: Secondary | ICD-10-CM | POA: Diagnosis not present

## 2020-09-24 DIAGNOSIS — R202 Paresthesia of skin: Secondary | ICD-10-CM

## 2020-09-24 HISTORY — DX: Transient cerebral ischemic attack, unspecified: G45.9

## 2020-09-24 LAB — CBC
HCT: 54.9 % — ABNORMAL HIGH (ref 39.0–52.0)
Hemoglobin: 18.4 g/dL — ABNORMAL HIGH (ref 13.0–17.0)
MCH: 30.8 pg (ref 26.0–34.0)
MCHC: 33.5 g/dL (ref 30.0–36.0)
MCV: 92 fL (ref 80.0–100.0)
Platelets: 179 10*3/uL (ref 150–400)
RBC: 5.97 MIL/uL — ABNORMAL HIGH (ref 4.22–5.81)
RDW: 12.1 % (ref 11.5–15.5)
WBC: 7.1 10*3/uL (ref 4.0–10.5)
nRBC: 0 % (ref 0.0–0.2)

## 2020-09-24 LAB — DIFFERENTIAL
Abs Immature Granulocytes: 0.14 10*3/uL — ABNORMAL HIGH (ref 0.00–0.07)
Basophils Absolute: 0.1 10*3/uL (ref 0.0–0.1)
Basophils Relative: 1 %
Eosinophils Absolute: 0.1 10*3/uL (ref 0.0–0.5)
Eosinophils Relative: 2 %
Immature Granulocytes: 2 %
Lymphocytes Relative: 20 %
Lymphs Abs: 1.4 10*3/uL (ref 0.7–4.0)
Monocytes Absolute: 0.7 10*3/uL (ref 0.1–1.0)
Monocytes Relative: 10 %
Neutro Abs: 4.6 10*3/uL (ref 1.7–7.7)
Neutrophils Relative %: 65 %

## 2020-09-24 LAB — COMPREHENSIVE METABOLIC PANEL
ALT: 35 U/L (ref 0–44)
AST: 24 U/L (ref 15–41)
Albumin: 4.1 g/dL (ref 3.5–5.0)
Alkaline Phosphatase: 71 U/L (ref 38–126)
Anion gap: 12 (ref 5–15)
BUN: 27 mg/dL — ABNORMAL HIGH (ref 8–23)
CO2: 18 mmol/L — ABNORMAL LOW (ref 22–32)
Calcium: 8.9 mg/dL (ref 8.9–10.3)
Chloride: 105 mmol/L (ref 98–111)
Creatinine, Ser: 1.45 mg/dL — ABNORMAL HIGH (ref 0.61–1.24)
GFR, Estimated: 55 mL/min — ABNORMAL LOW (ref >60)
Glucose, Bld: 92 mg/dL (ref 70–99)
Potassium: 4.2 mmol/L (ref 3.5–5.1)
Sodium: 135 mmol/L (ref 135–145)
Total Bilirubin: 1.1 mg/dL (ref 0.3–1.2)
Total Protein: 7.4 g/dL (ref 6.5–8.1)

## 2020-09-24 LAB — PROTIME-INR
INR: 1 (ref 0.8–1.2)
Prothrombin Time: 13 seconds (ref 11.4–15.2)

## 2020-09-24 LAB — CBG MONITORING, ED: Glucose-Capillary: 97 mg/dL (ref 70–99)

## 2020-09-24 LAB — APTT: aPTT: 30 seconds (ref 24–36)

## 2020-09-24 MED ORDER — LORAZEPAM 2 MG/ML IJ SOLN
INTRAMUSCULAR | Status: AC
  Filled 2020-09-24: qty 1

## 2020-09-24 MED ORDER — SODIUM CHLORIDE 0.9% FLUSH
3.0000 mL | Freq: Once | INTRAVENOUS | Status: DC
Start: 2020-09-24 — End: 2020-09-25

## 2020-09-24 MED ORDER — LORAZEPAM 2 MG/ML IJ SOLN
1.0000 mg | Freq: Once | INTRAMUSCULAR | Status: AC
  Administered 2020-09-24: 1 mg via INTRAVENOUS

## 2020-09-24 MED ORDER — GADOBUTROL 1 MMOL/ML IV SOLN
10.0000 mL | Freq: Once | INTRAVENOUS | Status: AC | PRN
  Administered 2020-09-24: 10 mL via INTRAVENOUS

## 2020-09-24 MED ORDER — TICAGRELOR 90 MG PO TABS
90.0000 mg | ORAL_TABLET | Freq: Two times a day (BID) | ORAL | 0 refills | Status: DC
Start: 2020-09-24 — End: 2020-11-15

## 2020-09-24 NOTE — ED Notes (Signed)
Pt to CT with Kathlee Nations, RN.

## 2020-09-24 NOTE — ED Provider Notes (Signed)
[facility] EMERGENCY DEPARTMENT Provider Note   CSN: [id number] Arrival date & time: 09/24/20  1455  An emergency department physician performed an initial assessment on this suspected stroke patient at 1550.  History Chief Complaint  Patient presents with  . Code Stroke    Gabriel Lin is a 59 y.o. male.  Patient with history of prior stroke September 11, 2020, presents with asymptomatic left-sided facial heaviness and numbness.  Patient states has had intermittently for the past 2 days.  2 days ago the symptoms only lasted about 2 to 3 seconds and then resolved.  Describes as a flush-like feeling and heaviness in the left cheek.  However today around 1 PM he was sitting down when he noticed the symptoms began, however the symptoms were persistent since 1 PM last known well was just prior to 1 PM patient states.  Denies any weakness elsewhere in his hands or extremities.  Denies any fever vomiting cough or diarrhea.        Past Medical History:  Diagnosis Date  . Cataract    "early"  . GERD (gastroesophageal reflux disease)   . Hyperlipidemia    no meds taken  . Hypertension   . Sleep apnea    wears CPA  . TIA (transient ischemic attack)     Patient Active Problem List   Diagnosis Date Noted  . Acute stroke due to ischemia ([place]) 09/12/2020  . TIA (transient ischemic attack) 09/10/2020  . Family history of colon cancer 07/02/2020  . Hx of adenomatous colonic polyps 07/02/2020    Past Surgical History:  Procedure Laterality Date  . BUBBLE STUDY  09/12/2020   Procedure: BUBBLE STUDY;  Surgeon: Buford Dresser, MD;  Location: [facility] [place] ENDOSCOPY;  Service: Cardiovascular;;  . COLONOSCOPY    . HERNIA REPAIR     double hernia repair- 2005 ?  Marland Kitchen LAMINECTOMY    . LOOP RECORDER INSERTION N/A 09/12/2020   Procedure: LOOP RECORDER INSERTION;  Surgeon: [name], MD;  Location: [facility] CV LAB;  Service: Cardiovascular;  Laterality: N/A;  . TEE  WITHOUT CARDIOVERSION N/A 09/12/2020   Procedure: TRANSESOPHAGEAL ECHOCARDIOGRAM (TEE);  Surgeon: Buford Dresser, MD;  Location: [facility] ENDOSCOPY;  Service: Cardiovascular;  Laterality: N/A;       Family History  Problem Relation Age of Onset  . Colon cancer Brother 50  . Colon polyps Brother   . Esophageal cancer Neg Hx   . Rectal cancer Neg Hx   . Stomach cancer Neg Hx     Social History   Tobacco Use  . Smoking status: Former Research scientist (life sciences)  . Smokeless tobacco: Never Used  . Tobacco comment: 2012 quit  Vaping Use  . Vaping Use: Some days  Substance Use Topics  . Alcohol use: Not Currently  . Drug use: Never    Home Medications Prior to Admission medications   Medication Sig Start Date End Date Taking? Authorizing Provider  aspirin EC 81 MG EC tablet Take 1 tablet (81 mg total) by mouth daily. Swallow whole. 09/13/20 11/12/20  Antonieta Pert, MD  atenolol (TENORMIN) 50 MG tablet Take 50 mg by mouth daily.  10/13/19   [provider]  ergocalciferol (VITAMIN D2) 1.25 MG (50000 UT) capsule Take 50,000 Units by mouth every 7 (seven) days.  07/21/19   [provider]  esomeprazole (NEXIUM) 40 MG capsule Take 1 tablet by mouth daily. 07/21/19   [provider]  melatonin 3 MG TABS tablet Take 6 mg by mouth at bedtime as  needed (For sleep).    [provider]  ticagrelor (BRILINTA) 90 MG TABS tablet Take 1 tablet (90 mg total) by mouth 2 (two) times daily. 09/24/20   Luna Fuse, MD    Allergies    Patient has no known allergies.  Review of Systems   Review of Systems  Constitutional: Negative for fever.  HENT: Negative for ear pain and sore throat.   Eyes: Negative for pain.  Respiratory: Negative for cough.   Cardiovascular: Negative for chest pain.  Gastrointestinal: Negative for abdominal pain.  Genitourinary: Negative for flank pain.  Musculoskeletal: Negative for back pain.  Skin: Negative for color change and rash.  Neurological:  Negative for syncope.  All other systems reviewed and are negative.   Physical Exam Updated Vital Signs BP 134/77   Pulse 70   Temp 97.7 F (36.5 C) (Oral)   Resp 19   Ht 6' (1.829 m)   Wt 111.6 kg   SpO2 98%   BMI 33.36 kg/m   Physical Exam Constitutional:      General: He is not in acute distress.    Appearance: He is well-developed.  HENT:     Head: Normocephalic.     Mouth/Throat:     Mouth: Mucous membranes are moist.  Cardiovascular:     Rate and Rhythm: Normal rate.  Pulmonary:     Effort: Pulmonary effort is normal.  Abdominal:     Palpations: Abdomen is soft.  Musculoskeletal:     Right lower leg: No edema.     Left lower leg: No edema.  Skin:    General: Skin is warm.     Capillary Refill: Capillary refill takes less than 2 seconds.  Neurological:     Mental Status: He is alert.     Comments: Cranial nerves II through XII intact.  Strength 5/5 all extremities.  Gait normal without assistance.  Patient expresses sensory deficit of the left side of the face and heaviness sensation of the left-sided face.     ED Results / Procedures / Treatments   Labs (all labs ordered are listed, but only abnormal results are displayed) Labs Reviewed  CBC - Abnormal; Notable for the following components:      Result Value   RBC 5.97 (*)    Hemoglobin 18.4 (*)    HCT 54.9 (*)    All other components within normal limits  DIFFERENTIAL - Abnormal; Notable for the following components:   Abs Immature Granulocytes 0.14 (*)    All other components within normal limits  COMPREHENSIVE METABOLIC PANEL - Abnormal; Notable for the following components:   CO2 18 (*)    BUN 27 (*)    Creatinine, Ser 1.45 (*)    GFR, Estimated 55 (*)    All other components within normal limits  PROTIME-INR  APTT  I-STAT CHEM 8, ED  CBG MONITORING, ED    EKG None  Radiology MR ANGIO HEAD WO CONTRAST  Result Date: 09/24/2020 CLINICAL DATA:  Neuro deficit, acute, stroke suspected.  EXAM: MRI HEAD WITHOUT CONTRAST MRA HEAD WITHOUT CONTRAST TECHNIQUE: Multiplanar, multiecho pulse sequences of the brain and surrounding structures were obtained without intravenous contrast. Angiographic images of the head were obtained using MRA technique without contrast. COMPARISON:  MRI/MRA head 09/11/2020. FINDINGS: MRI HEAD FINDINGS Brain: Cerebral volume is normal for age. Redemonstrated 6 mm focus of diffusion weighted hyperintensity within the mid right frontal white matter (series 2, image 35). Corresponding intermediate signal on the ADC map. In  retrospect, this finding was present on the prior MRI of 09/11/2020 and likely reflects a subacute infarction. Corresponding T2/FLAIR hyperintensity at this site. A previously demonstrated 4 mm infarct within the left frontal lobe precentral gyrus was better appreciated on the prior MRI of 09/11/2020 (acute at that time). Minimal multifocal T2/FLAIR hyperintensity within the cerebral white matter elsewhere is nonspecific, but compatible chronic small vessel ischemic disease. Chronic microhemorrhage within the right temporoparietal junction (series 7, image 50). No evidence of intracranial mass. No extra-axial fluid collection. No midline shift. Vascular: Reported below. Skull and upper cervical spine: No focal marrow lesion. Sinuses/Orbits: Visualized orbits show no acute finding. Trace ethmoid sinus mucosal thickening. Small right maxillary sinus mucous retention cyst. Other: Bilateral mastoid effusions (larger on the right). MRA HEAD FINDINGS The intracranial internal carotid arteries are patent. The M1 middle cerebral arteries are patent. No M2 proximal branch occlusion or high-grade proximal stenosis is identified. The anterior cerebral arteries are patent. Mild atherosclerotic narrowing within the proximal A1 right ACA (series 552, image 11). The vertebral and basilar arteries are developmentally diminutive. The intracranial vertebral arteries are patent.  The left vertebral artery terminates predominantly as the left PICA with only a small contribution to the basilar artery. The basilar artery is patent. Fetal origin right posterior cerebral artery. A sizable left posterior communicating artery is also present. The posterior cerebral arteries are patent. No intracranial aneurysm is identified. IMPRESSION: MRI brain: 1. 6 mm focus of subtle restricted diffusion within the right frontal white matter, likely reflecting a subacute infarct. In retrospect, this finding was present on the prior MRI of 09/11/2020. 2. A previously demonstrated 4 mm infarct within the left frontal lobe precentral gyrus was better appreciated on the prior MRI (acute at that time). 3. Background mild chronic small vessel ischemic disease. 4. Bilateral mastoid effusions. MRA head: 1. Stable examination as compared to the MRA of 09/11/2020. 2. No intracranial large vessel occlusion or proximal high-grade arterial stenosis. 3. Mild atherosclerotic narrowing of the proximal A1 right anterior cerebral artery. Electronically Signed   By: Kellie Simmering DO   On: 09/24/2020 17:34   MR ANGIO NECK W WO CONTRAST  Result Date: 09/24/2020 CLINICAL DATA:  Neuro deficit, acute, stroke suspected. Left side of face heavy and asymmetrical since 12 p.m. today. EXAM: MRA NECK WITHOUT AND WITH CONTRAST TECHNIQUE: Multiplanar and multiecho pulse sequences of the neck were obtained without and with intravenous contrast. Angiographic images of the neck were obtained using MRA technique without and with intravenous contrast. CONTRAST:  47mL GADAVIST GADOBUTROL 1 MMOL/ML IV SOLN COMPARISON:  MRA neck 09/11/2020. FINDINGS: Standard aortic branching. No appreciable hemodynamically significant stenosis of the innominate or proximal subclavian arteries. The right common and internal carotid arteries are patent within the neck without stenosis. Minimal atherosclerotic irregularity within the carotid bulb. The left CCA is  widely patent to the bifurcation. Unchanged 40% stenosis at the origin of the left ICA with possible ulcerated plaque immediately distal to the stenosis (series 1505, image 3). Distal to this, the cervical ICA is widely patent. The vertebral arteries are patent within the neck without stenosis. The right vertebral artery is slightly dominant. IMPRESSION: Stable examination as compared to the MRA neck of 09/11/2020. 40% stenosis at the origin of the left ICA with possible ulcerated plaque immediately distal to the stenosis. There is otherwise wide patency of both carotid artery systems within the neck. Vertebral arteries patent within the neck bilaterally without stenosis. The right vertebral artery is slightly dominant. Electronically  Signed   By: Kellie Simmering DO   On: 09/24/2020 17:43   MR BRAIN WO CONTRAST  Result Date: 09/24/2020 CLINICAL DATA:  Neuro deficit, acute, stroke suspected. EXAM: MRI HEAD WITHOUT CONTRAST MRA HEAD WITHOUT CONTRAST TECHNIQUE: Multiplanar, multiecho pulse sequences of the brain and surrounding structures were obtained without intravenous contrast. Angiographic images of the head were obtained using MRA technique without contrast. COMPARISON:  MRI/MRA head 09/11/2020. FINDINGS: MRI HEAD FINDINGS Brain: Cerebral volume is normal for age. Redemonstrated 6 mm focus of diffusion weighted hyperintensity within the mid right frontal white matter (series 2, image 35). Corresponding intermediate signal on the ADC map. In retrospect, this finding was present on the prior MRI of 09/11/2020 and likely reflects a subacute infarction. Corresponding T2/FLAIR hyperintensity at this site. A previously demonstrated 4 mm infarct within the left frontal lobe precentral gyrus was better appreciated on the prior MRI of 09/11/2020 (acute at that time). Minimal multifocal T2/FLAIR hyperintensity within the cerebral white matter elsewhere is nonspecific, but compatible chronic small vessel ischemic disease.  Chronic microhemorrhage within the right temporoparietal junction (series 7, image 50). No evidence of intracranial mass. No extra-axial fluid collection. No midline shift. Vascular: Reported below. Skull and upper cervical spine: No focal marrow lesion. Sinuses/Orbits: Visualized orbits show no acute finding. Trace ethmoid sinus mucosal thickening. Small right maxillary sinus mucous retention cyst. Other: Bilateral mastoid effusions (larger on the right). MRA HEAD FINDINGS The intracranial internal carotid arteries are patent. The M1 middle cerebral arteries are patent. No M2 proximal branch occlusion or high-grade proximal stenosis is identified. The anterior cerebral arteries are patent. Mild atherosclerotic narrowing within the proximal A1 right ACA (series 552, image 11). The vertebral and basilar arteries are developmentally diminutive. The intracranial vertebral arteries are patent. The left vertebral artery terminates predominantly as the left PICA with only a small contribution to the basilar artery. The basilar artery is patent. Fetal origin right posterior cerebral artery. A sizable left posterior communicating artery is also present. The posterior cerebral arteries are patent. No intracranial aneurysm is identified. IMPRESSION: MRI brain: 1. 6 mm focus of subtle restricted diffusion within the right frontal white matter, likely reflecting a subacute infarct. In retrospect, this finding was present on the prior MRI of 09/11/2020. 2. A previously demonstrated 4 mm infarct within the left frontal lobe precentral gyrus was better appreciated on the prior MRI (acute at that time). 3. Background mild chronic small vessel ischemic disease. 4. Bilateral mastoid effusions. MRA head: 1. Stable examination as compared to the MRA of 09/11/2020. 2. No intracranial large vessel occlusion or proximal high-grade arterial stenosis. 3. Mild atherosclerotic narrowing of the proximal A1 right anterior cerebral artery.  Electronically Signed   By: Kellie Simmering DO   On: 09/24/2020 17:34   CT HEAD CODE STROKE WO CONTRAST  Addendum Date: 09/24/2020   ADDENDUM REPORT: 09/24/2020 16:13 ADDENDUM: Correction: These results were communicated to Dr. Rory Percy At 4:13 pmon 12/5/2021by text page via the [facility] messaging system. Electronically Signed   By: Kellie Simmering DO   On: 09/24/2020 16:13   Result Date: 09/24/2020 CLINICAL DATA:  Code stroke. Neuro deficit, acute, stroke suspected. Left facial numbness. EXAM: CT HEAD WITHOUT CONTRAST TECHNIQUE: Contiguous axial images were obtained from the base of the skull through the vertex without intravenous contrast. COMPARISON:  MRI brain/MRA head 09/11/2020. FINDINGS: Brain: Cerebral volume is normal. There is no acute intracranial hemorrhage. No demarcated cortical infarct. No extra-axial fluid collection. No evidence of intracranial mass. No midline  shift. Vascular: No hyperdense vessel. Skull: Normal. Negative for fracture or focal lesion. Sinuses/Orbits: Visualized orbits show no acute finding. Small right maxillary sinus mucous retention cyst. Other: Bilateral mastoid effusions. ASPECTS ([place] Stroke Program Early CT Score) - Ganglionic level infarction (caudate, lentiform nuclei, internal capsule, insula, M1-M3 cortex): 7 - Supraganglionic infarction (M4-M6 cortex): 3 Total score (0-10 with 10 being normal): 10 These results were communicated to Dr. Cheral Marker At 4:02 pmon 12/5/2021by text page via the [facility] messaging system. IMPRESSION: No evidence of acute intracranial abnormality. ASPECTS is 10. Small right maxillary sinus mucous retention cyst. Bilateral mastoid effusions. Electronically Signed: By: Kellie Simmering DO On: 09/24/2020 16:03    Procedures .Critical Care Performed by: Luna Fuse, MD Authorized by: Luna Fuse, MD   Critical care provider statement:    Critical care time (minutes):  50   Critical care time was exclusive of:  Separately billable procedures and  treating other patients and teaching time   Critical care was necessary to treat or prevent imminent or life-threatening deterioration of the following conditions:  CNS failure or compromise   (including critical care time)  Medications Ordered in ED Medications  sodium chloride flush (NS) 0.9 % injection 3 mL (has no administration in time range)  LORazepam (ATIVAN) 2 MG/ML injection (has no administration in time range)  LORazepam (ATIVAN) injection 1 mg (1 mg Intravenous Given 09/24/20 1620)  gadobutrol (GADAVIST) 1 MMOL/ML injection 10 mL (10 mLs Intravenous Contrast Given 09/24/20 1703)    ED Course  I have reviewed the triage vital signs and the nursing notes.  Pertinent labs & imaging results that were available during my care of the patient were reviewed by me and considered in my medical decision making (see chart for details).    MDM Rules/Calculators/A&P                          Patient's history of recent prior stroke and new onset of sensory symptoms, elected to activate a stroke alert.  Systolic blood pressure under 027 systolic.  Stroke team has been alerted for patient evaluation.  Patient not acutely candidate given recent stroke and stating that symptoms today may have started yesterday intermittently.  CT imaging unremarkable MRI shows acute versus subacute stroke.  Patient's regimen increased to aspirin and Brilinta.  Advised to stop Plavix.  Recommend outpatient follow-up with neurology within a week.  Advised return for new numbness weakness or any additional concerns.   Final Clinical Impression(s) / ED Diagnoses Final diagnoses:  Acute stroke due to ischemia [facility])    Rx / DC Orders ED Discharge Orders         Ordered    ticagrelor (BRILINTA) 90 MG TABS tablet  2 times daily        09/24/20 1856           Luna Fuse, MD 09/24/20 [phone]

## 2020-09-24 NOTE — Code Documentation (Addendum)
Stroke Response Nurse Documentation Code Documentation  Gabriel Lin is a 59 y.o. male arriving to [place] ED via Private Vehicle on 09/24/2020 with past medical hx of HTN, hyperlipidemia, OSA on CPAP. Code stroke was activated by ED. Patient from home where he was LKW at 1300 and now complaining of decreased left facial sensation. On aspirin 81 mg daily and clopidogrel 75 mg daily. Stroke team met pt in CT. Cleared for CT by Dr. Almyra Free. NIHSS 1, see documentation for details and code stroke times. Patient with left facial decreased sensation on exam. The following imaging was completed: CT, MRI. Patient is not a candidate for tPA due to symptoms being to mild to treat.   Plan: q15 min VS & q30 NIHSS until out of tPA window, 1730 Bedside handoff with ED RN [place]  Rapid Response RN

## 2020-09-24 NOTE — ED Notes (Addendum)
Patient transported to MRI 

## 2020-09-24 NOTE — ED Triage Notes (Addendum)
Pt reports "L side of face heavy and asymmetrical" since 12pm today.  Pt has no facial droop.  No arm drift.  Speech clear.  Sensory deficit only.  [place], RN also evaluated pt.  Spoke with [place], [place] and no code stroke activation at this time.

## 2020-09-24 NOTE — ED Notes (Signed)
Dr. Almyra Free to lobby to assess pt after discussing with PA and Code Stroke activated.

## 2020-09-24 NOTE — Consult Note (Addendum)
Neurology Consultation  Reason for Consult: code stroke activation for left facial numbness  Requesting Physician: Dr Thamas Jaegers, EDP  CC: left face numbness.   History is obtained from patient.   HPI: Gabriel Lin is a 59 y.o. male  significant for hypertension, hyperlipidemia, obesity, obstructive sleep apnea on CPAP, NASH, GERD, presenting with an acute episode of left facial numbness in setting of recent left frontal lobe stroke on 09/11/20. He reports that he was in usual state of health  Until two days ago when he developed left "heaviness" intermittently that would last only up to 5 seconds then resolve spontaneously. This has continued over the past two days, and  Today, while at home sitting around 1300,  He noticed  what he describes as anesthesia involving his left face. He presented to the ED as a code stroke activation at 1600.   On approach he was pleasant and cooperative, and initial NIHSS was 1 for decreased sensation at left face; neurologic exam was otherwise unremarkable. Stat head CT did not show any acute intracranial abnormalities. Patient began to complain of dizziness upon arising from CT table and was taken to MRI for additional imaging.  Details below.  Complete stroke workup done during last admission  On 09/11/20 included:  1. negative head CT  2.  MRI brain showing 67mm DWI abnormality at posterior left frontal lobe 3.  MRA head without abnormalities  4.  MRA neck showing 40% left origin ICA stenosis  5.  Carotid ultrasound showing no significant extracranial stenosis.   6.  Lower extremity venous Dopplers negative for DVT.   7.  Transcranial Doppler bubble study  showing few bubbles with slight I                    increase after Valsalva maneuver but likely clinically insignificant right-to-left      shunt.   8.  Transesophageal echocardiogram without evidence of intra-atrial clot or              PFO but did show presence of intra-atrial bubble suggestive of  small                  right-to-left shunt.   He was discharged to home on hospital day 2 on aspirin and plavix; he was to be on dual antiplatelets for 3 weeks and continue with aspirin only thereafter and follow up as outpatient with Dr. Leonie Man. .   Notably he stopped statin due to NASH x 2 years by PCP.   Evaluated by cardiology-Dr. Burt Knack, who recommended medical management with dual antiplatelets as above and reserved discussion about PFO closure only in the setting of recurrent symptoms.  Appearance of strokes does not look to be cardioembolic.   LKW: 1300 tPA given?: No, recent stroke 2 weeks ago   Premorbid modified rankin scale:  0      ROS: ROS was performed and is negative except as noted in the  HPI.   Past Medical History:  Diagnosis Date  . Cataract    "early"  . GERD (gastroesophageal reflux disease)   . Hyperlipidemia    no meds taken  . Hypertension   . Sleep apnea    wears CPA  . TIA (transient ischemic attack)     Family History  Problem Relation Age of Onset  . Colon cancer Brother 25  . Colon polyps Brother   . Esophageal cancer Neg Hx   . Rectal cancer Neg Hx   .  Stomach cancer Neg Hx       Social History:  reports that he has quit smoking. He has never used smokeless tobacco. He reports previous alcohol use. He reports that he does not use drugs.    Exam: Current vital signs: BP (!) 186/98 (BP Location: Right Arm)   Pulse 85   Temp 97.7 F (36.5 C) (Oral)   Resp 18   SpO2 100%  Vital signs in last 24 hours: Temp:  [97.7 F (36.5 C)] 97.7 F (36.5 C) (12/05 1506) Pulse Rate:  [85] 85 (12/05 1506) Resp:  [17-18] 18 (12/05 1606) BP: (159-186)/(96-98) 186/98 (12/05 1606) SpO2:  [98 %-100 %] 100 % (12/05 1606)   Physical Exam  Constitutional: Appears well-developed and well-nourished.  Psych: Affect appropriate to situation Eyes: No scleral injection HENT: No OP obstrucion MSK: no joint deformities.  Cardiovascular: Normal rate and  regular rhythm.  Respiratory: Effort normal, non-labored breathing GI: Soft.  No distension. There is no tenderness.  Skin: WDI  Neuro: Mental Status: Patient is awake, alert, oriented to person, place, month, year, and situation.  Patient is able to give a clear and coherent history.  No signs of aphasia or neglect  Cranial Nerves: II: Visual Fields are full. Pupils are equal, round, and reactive to light.    III,IV, VI: EOMI without ptosis or diploplia.  V: diminished sensation  to left face (feeling of subjective numbness, but could feel the touch symmetrically).  VII: Facial movement is symmetric.  VIII: hearing is intact to voice X: Uvula elevates symmetrically XI: Shoulder shrug is symmetric. XII: tongue is midline without atrophy or fasciculations.  Motor: Tone is normal. Bulk is normal. 5/5 strength was present in all four extremities.   Sensory: Sensation is symmetric to light touch and temperature in the arms and legs.  Deep Tendon Reflexes: 2+ and symmetric in the biceps and patellae.   Plantars: Toes are downgoing bilaterally.   Cerebellar: FNF and HKS are intact bilaterally   NIHSS total 1  I have reviewed labs in epic and the results pertinent to this consultation are:  Results for Gabriel Lin, Gabriel Lin (MRN [id number]) as of 09/24/2020 16:27  Ref. Range 09/24/2020 15:35  Sodium Latest Ref Range: 135 - 145 mmol/L 135  Potassium Latest Ref Range: 3.5 - 5.1 mmol/L 4.2  Chloride Latest Ref Range: 98 - 111 mmol/L 105  CO2 Latest Ref Range: 22 - 32 mmol/L 18 (L)  Glucose Latest Ref Range: 70 - 99 mg/dL 92  BUN Latest Ref Range: 8 - 23 mg/dL 27 (H)  Creatinine Latest Ref Range: 0.61 - 1.24 mg/dL 1.45 (H)  Calcium Latest Ref Range: 8.9 - 10.3 mg/dL 8.9  Anion gap Latest Ref Range: 5 - 15  12  Alkaline Phosphatase Latest Ref Range: 38 - 126 U/L 71  Albumin Latest Ref Range: 3.5 - 5.0 g/dL 4.1  AST Latest Ref Range: 15 - 41 U/L 24  ALT Latest Ref Range: 0 - 44 U/L 35  Total  Protein Latest Ref Range: 6.5 - 8.1 g/dL 7.4  Total Bilirubin Latest Ref Range: 0.3 - 1.2 mg/dL 1.1  GFR, Estimated Latest Ref Range: >60 mL/min 55 (L)  Results for Gabriel Lin, Gabriel Lin (MRN [id number]) as of 09/24/2020 16:27  Ref. Range 09/24/2020 15:35  WBC Latest Ref Range: 4.0 - 10.5 K/uL 7.1  RBC Latest Ref Range: 4.22 - 5.81 MIL/uL 5.97 (H)  Hemoglobin Latest Ref Range: 13.0 - 17.0 g/dL 18.4 (H)  HCT Latest Ref Range: 39 - 52 %  54.9 (H)  MCV Latest Ref Range: 80.0 - 100.0 fL 92.0  MCH Latest Ref Range: 26.0 - 34.0 pg 30.8  MCHC Latest Ref Range: 30.0 - 36.0 g/dL 33.5  RDW Latest Ref Range: 11.5 - 15.5 % 12.1  Platelets Latest Ref Range: 150 - 400 K/uL 179  Results for Gabriel Lin, Gabriel Lin (MRN [id number]) as of 09/24/2020 16:27  Ref. Range 09/11/2020 01:40  Total CHOL/HDL Ratio Latest Units: RATIO 5.3  Cholesterol Latest Ref Range: 0 - 200 mg/dL 217 (H)  HDL Cholesterol Latest Ref Range: >40 mg/dL 41  LDL (calc) Latest Ref Range: 0 - 99 mg/dL 142 (H)  Triglycerides Latest Ref Range: <150 mg/dL 170 (H)  VLDL Latest Ref Range: 0 - 40 mg/dL 34   Results for Gabriel Lin, Gabriel Lin (MRN [id number]) as of 09/24/2020 16:27  Ref. Range 09/11/2020 05:00  Hemoglobin A1C Latest Ref Range: 4.8 - 5.6 % 4.9          Attending Neurohospitalist Addendum Patient seen and examined with APP/Resident. Agree with the history and physical as documented above. Agree with the plan as documented, which I helped formulate. I have independently reviewed the chart, obtained history, review of systems and examined the patient.  I have personally reviewed the images obtained:  CT head: No acute changes MRI brain: 6 mm focus of subtle restricted diffusion in the right frontal white matter likely subacute infarct in hindsight, this was present on 09/11/2020 but not as clearly visible.  A previously demonstrated 4 mm infarct within the left frontal lobe precentral gyrus was better appreciated on the prior MRI 13 days  ago.  Mild chronic small vessel disease and bilateral mastoid effusions unchanged. MRA head and neck-unchanged from before.  40% stenosis of the origin of the left ICA.  Assessment: 59 year old with above past medical history presenting with sudden onset of right facial numbness. MRI reveals an area of subtle restricted diffusion which probably was also present 13 days ago when he had a stroke that involved his left arm for numbness. Has reported some dizziness but also has some inner ear issues and vertigo at baseline. Given the recent stroke, and the MRI findings, I discussed this with Dr. Leonie Man as well and we will change his dual antiplatelets for now and he will continue follow-up with outpatient neurology and cardiology.  Impression: Acute/subacute ischemic stroke-likely subacute-likely small vessel disease. Getting evaluated for cardioembolic source-loop recorder was placed few weeks ago.   Recommendations: No need for admission. Work up recently completed. Change DAPT to ASA and Brilinta for 3 weeks. Maintain normotension I interrogated the loop recorder-pictures pasted in the media section.  No evidence of atrial fibrillation. Follow up with Dr. Burt Knack (cardiology) and Dr. Leonie Man (neurology) in the next 2-4 weeks.  I had a detailed conversation with the patient and his wife regarding red flag symptoms.  If the symptoms include very minimal paresthesias, he should call his primary care and check neck steps but if he has any evidence of facial weakness, difficulties with speech, balance issues, vision issues or frank weakness in any of the limbs, he should call 911 and come in for emergent evaluation.  Imaging and plan was briefly discussed with Dr. Leonie Man  Please feel free to call with any questions.  Discussed my plan with Dr. Benson Norway in the ER. --- Amie Portland, MD [facility] Pager: [phone]

## 2020-09-24 NOTE — Discharge Instructions (Addendum)
Call your primary care doctor or specialist as discussed in the next 2-3 days.    Stop your Plavix.  Instead of Plavix start Brilinta.   Return immediately back to the ER if:  Your symptoms worsen within the next 12-24 hours. You develop new symptoms such as new fevers, persistent vomiting, new pain, shortness of breath, or new weakness or numbness, or if you have any other concerns.

## 2020-09-25 LAB — I-STAT CHEM 8, ED
BUN: 30 mg/dL — ABNORMAL HIGH (ref 8–23)
Calcium, Ion: 1.15 mmol/L (ref 1.15–1.40)
Chloride: 106 mmol/L (ref 98–111)
Creatinine, Ser: 1.4 mg/dL — ABNORMAL HIGH (ref 0.61–1.24)
Glucose, Bld: 94 mg/dL (ref 70–99)
HCT: 53 % — ABNORMAL HIGH (ref 39.0–52.0)
Hemoglobin: 18 g/dL — ABNORMAL HIGH (ref 13.0–17.0)
Potassium: 4.2 mmol/L (ref 3.5–5.1)
Sodium: 136 mmol/L (ref 135–145)
TCO2: 19 mmol/L — ABNORMAL LOW (ref 22–32)

## 2020-09-27 NOTE — Telephone Encounter (Signed)
Called patient and spoke to him, stated the SOB waxes and wains about an hour after taking the medication.  Just started about 2 days ago after he started taking the Brilinta.  Patient has already taking the Brilinta this morning, stated the SOB is 'so bad' but resolves after a while and he is aware that is a side effect of the medication.  Patient advised to called 911 and seek emergency treatment if SOB increases.  He was able to speak full sentences without difficulty or s/s of SOB on the phone.  Patient is aware Dr. Leonie Man is out of office this week and this will be sent to the [facility] for further advisement.

## 2020-09-28 ENCOUNTER — Other Ambulatory Visit: Payer: Self-pay

## 2020-09-28 ENCOUNTER — Ambulatory Visit (INDEPENDENT_AMBULATORY_CARE_PROVIDER_SITE_OTHER): Payer: BC Managed Care – PPO | Admitting: Emergency Medicine

## 2020-09-28 DIAGNOSIS — G459 Transient cerebral ischemic attack, unspecified: Secondary | ICD-10-CM

## 2020-09-28 LAB — CUP PACEART INCLINIC DEVICE CHECK
Date Time Interrogation Session: 20211209093834
Implantable Pulse Generator Implant Date: 20211123

## 2020-09-28 NOTE — Progress Notes (Signed)
ILR wound check in clinic. Steri strips removed. Wound well healed. Home monitor transmitting nightly. No episodes. R waves measured 0.81mv.  Device programmed appropriately for indications.  Questions answered.

## 2020-09-28 NOTE — Patient Instructions (Signed)
Wash incision with soap and water only.

## 2020-10-02 NOTE — Telephone Encounter (Signed)
  Hello Pramod , can you answer the patient's questions about duration of therapy?    Dear Mr. Maslow,  Dr Leonie Man was not in office last week but his nurse had advised you to go to the ED if symptoms worsen. I am glad you did well over the weekend and didn't need to go. . I will forward the question about how long therapy is needed on this new medication-  I am unfamiliar with Brilinta and could not find information on SOB being related to this medication.  Glad the symptoms were resolved.

## 2020-10-02 NOTE — Telephone Encounter (Signed)
I called the patient and left a message on his answering machine stating that my plan was to give him Brilinta only for 30 days and then stop it.  Transient shortness of breath has been described with present apathy does not lead to low oxygen levels or any major concerns.  If he has any further questions he was asked to give me a call thank you

## 2020-10-03 ENCOUNTER — Telehealth: Payer: Self-pay | Admitting: Emergency Medicine

## 2020-10-03 NOTE — Telephone Encounter (Signed)
Called patient and discussed Dr. Clydene Fake message.  Patient returned understanding and denied any further questions. Patient expressed appreciation for the phone call.

## 2020-10-04 ENCOUNTER — Institutional Professional Consult (permissible substitution): Payer: BC Managed Care – PPO | Admitting: Cardiovascular Disease

## 2020-10-16 ENCOUNTER — Ambulatory Visit (INDEPENDENT_AMBULATORY_CARE_PROVIDER_SITE_OTHER): Payer: BC Managed Care – PPO

## 2020-10-16 DIAGNOSIS — G459 Transient cerebral ischemic attack, unspecified: Secondary | ICD-10-CM

## 2020-10-16 LAB — CUP PACEART REMOTE DEVICE CHECK
Date Time Interrogation Session: 20211226184723
Implantable Pulse Generator Implant Date: 20211123

## 2020-10-23 ENCOUNTER — Telehealth: Payer: Self-pay | Admitting: Neurology

## 2020-10-23 NOTE — Telephone Encounter (Signed)
Called patient and discussed that it is normal to bruise easier with any anticoagulant medication, including even baby aspirin.  Patient stated he was okay and didn't feel he was having bleeding.  He will finish medication on Wednesday.

## 2020-10-23 NOTE — Telephone Encounter (Signed)
Please call patient and advise him to continue to monitor for bruising.  I don't recommend any immediate medication change, I will let Dr. Pearlean Brownie decide if he wants to make any changes to the medication.

## 2020-10-23 NOTE — Telephone Encounter (Signed)
Thanks.  I agree with your plan.

## 2020-10-26 NOTE — Progress Notes (Signed)
Carelink Summary Report / Loop Recorder 

## 2020-10-30 ENCOUNTER — Inpatient Hospital Stay: Payer: Self-pay | Admitting: Neurology

## 2020-11-02 ENCOUNTER — Ambulatory Visit (HOSPITAL_COMMUNITY)
Admission: EM | Admit: 2020-11-02 | Discharge: 2020-11-02 | Discharge status: Against Medical Advice | Payer: BC Managed Care – PPO | Attending: Family Medicine | Admitting: Family Medicine

## 2020-11-02 ENCOUNTER — Encounter (HOSPITAL_COMMUNITY): Payer: Self-pay

## 2020-11-02 ENCOUNTER — Other Ambulatory Visit: Payer: Self-pay

## 2020-11-02 NOTE — ED Triage Notes (Signed)
Pt c/o left foot pain x 5 days. Pt states he feels he a ball is growing under his foot that is making it difficult to walk. Pt states he does not remember injuring his foot.

## 2020-11-03 ENCOUNTER — Other Ambulatory Visit: Payer: Self-pay

## 2020-11-03 ENCOUNTER — Ambulatory Visit (INDEPENDENT_AMBULATORY_CARE_PROVIDER_SITE_OTHER): Payer: BC Managed Care – PPO | Admitting: Podiatry

## 2020-11-03 ENCOUNTER — Ambulatory Visit (INDEPENDENT_AMBULATORY_CARE_PROVIDER_SITE_OTHER): Payer: BC Managed Care – PPO

## 2020-11-03 DIAGNOSIS — M9272 Juvenile osteochondrosis of metatarsus, left foot: Secondary | ICD-10-CM

## 2020-11-03 DIAGNOSIS — M7752 Other enthesopathy of left foot: Secondary | ICD-10-CM

## 2020-11-03 DIAGNOSIS — M21962 Unspecified acquired deformity of left lower leg: Secondary | ICD-10-CM | POA: Diagnosis not present

## 2020-11-03 NOTE — Progress Notes (Signed)
  Subjective:  Patient ID: Gabriel Lin, male    DOB: 15-Oct-1961,  MRN: [id number]  Chief Complaint  Patient presents with  . Foot Pain    PT STATES HE HAS LEFT FOOT COMING FROM THE BALL OF FOOT.     60 y.o. male presents with the above complaint. History confirmed with patient.  Reports 1 week history of severe pain, redness and swelling to the top of the foot states that it hurts on the ball and on the top.  Denies injury to the area.  Objective:  Physical Exam: warm, good capillary refill, no trophic changes or ulcerative lesions, normal DP and PT pulses and normal sensory exam. Left Foot: Local warmth and severe pain to the dorsal second metatarsal phalangeal joint, some of the third metatarsal phalangeal joint.  No images are attached to the encounter.  Radiographs: X-ray of the left foot: Elongated second and third metatarsals with possible flattening of the second metatarsal. Assessment:   1. Capsulitis of metatarsophalangeal (MTP) joint of left foot   2. Freiberg's disease, left   3. Metatarsal deformity, left    Plan:  Patient was evaluated and treated and all questions answered.  Left second and third metatarsal phalangeal joint acute capsulitis with symptoms concerning for acute Freiberg infarction -Educated on etiology -XR reviewed with patient  -Rx meloxicam to reduce inflammatory component -Offload foot with surgical shoe. -Follow-up in 3 weeks for repeat x-rays.  We will consider injection at that time if less symptomatic.  Return in about 3 weeks (around 11/24/2020) for Freiberg Infarction F/u, with XRs.

## 2020-11-15 ENCOUNTER — Ambulatory Visit (INDEPENDENT_AMBULATORY_CARE_PROVIDER_SITE_OTHER): Payer: BC Managed Care – PPO | Admitting: Neurology

## 2020-11-15 ENCOUNTER — Encounter: Payer: Self-pay | Admitting: Neurology

## 2020-11-15 VITALS — BP 124/75 | HR 72 | Ht 72.0 in | Wt 240.0 lb

## 2020-11-15 DIAGNOSIS — Q2112 Patent foramen ovale: Secondary | ICD-10-CM

## 2020-11-15 DIAGNOSIS — I639 Cerebral infarction, unspecified: Secondary | ICD-10-CM | POA: Diagnosis not present

## 2020-11-15 DIAGNOSIS — Q211 Atrial septal defect: Secondary | ICD-10-CM | POA: Diagnosis not present

## 2020-11-15 NOTE — Patient Instructions (Signed)
I had a long d/w patient about his recent stroke, risk for recurrent stroke/TIAs, personally independently reviewed imaging studies and stroke evaluation results and answered questions.Continue aspirin 81 mg daily  for secondary stroke prevention and maintain strict control of hypertension with blood pressure goal below 130/90, diabetes with hemoglobin A1c goal below 6.5% and lipids with LDL cholesterol goal below 70 mg/dL. I also advised the patient to eat a healthy diet with plenty of whole grains, cereals, fruits and vegetables, exercise regularly and maintain ideal body weight.  He may consider possible participation in the [place] trial if interested and was given information to review at home and decide.  Followup in the future with me in 6 months or call earlier if necessary.  Stroke Prevention Some medical conditions and behaviors are associated with a higher chance of having a stroke. You can help prevent a stroke by making nutrition, lifestyle, and other changes, including managing any medical conditions you may have. What nutrition changes can be made?  Eat healthy foods. You can do this by: ? Choosing foods high in fiber, such as fresh fruits and vegetables and whole grains. ? Eating at least 5 or more servings of fruits and vegetables a day. Try to fill half of your plate at each meal with fruits and vegetables. ? Choosing lean protein foods, such as lean cuts of meat, poultry without skin, fish, tofu, beans, and nuts. ? Eating low-fat dairy products. ? Avoiding foods that are high in salt (sodium). This can help lower blood pressure. ? Avoiding foods that have saturated fat, trans fat, and cholesterol. This can help prevent high cholesterol. ? Avoiding processed and premade foods.  Follow your health care provider's specific guidelines for losing weight, controlling high blood pressure (hypertension), lowering high cholesterol, and managing diabetes. These may include: ? Reducing your  daily calorie intake. ? Limiting your daily sodium intake to 1,500 milligrams (mg). ? Using only healthy fats for cooking, such as olive oil, canola oil, or sunflower oil. ? Counting your daily carbohydrate intake.   What lifestyle changes can be made?  Maintain a healthy weight. Talk to your health care provider about your ideal weight.  Get at least 30 minutes of moderate physical activity at least 5 days a week. Moderate activity includes brisk walking, biking, and swimming.  Do not use any products that contain nicotine or tobacco, such as cigarettes and e-cigarettes. If you need help quitting, ask your health care provider. It may also be helpful to avoid exposure to secondhand smoke.  Limit alcohol intake to no more than 1 drink a day for nonpregnant women and 2 drinks a day for men. One drink equals 12 oz of beer, 5 oz of wine, or 1 oz of hard liquor.  Stop any illegal drug use.  Avoid taking birth control pills. Talk to your health care provider about the risks of taking birth control pills if: ? You are over 62 years old. ? You smoke. ? You get migraines. ? You have ever had a blood clot. What other changes can be made?  Manage your cholesterol levels. ? Eating a healthy diet is important for preventing high cholesterol. If cholesterol cannot be managed through diet alone, you may also need to take medicines. ? Take any prescribed medicines to control your cholesterol as told by your health care provider.  Manage your diabetes. ? Eating a healthy diet and exercising regularly are important parts of managing your blood sugar. If your blood sugar cannot be  managed through diet and exercise, you may need to take medicines. ? Take any prescribed medicines to control your diabetes as told by your health care provider.  Control your hypertension. ? To reduce your risk of stroke, try to keep your blood pressure below 130/80. ? Eating a healthy diet and exercising regularly are an  important part of controlling your blood pressure. If your blood pressure cannot be managed through diet and exercise, you may need to take medicines. ? Take any prescribed medicines to control hypertension as told by your health care provider. ? Ask your health care provider if you should monitor your blood pressure at home. ? Have your blood pressure checked every year, even if your blood pressure is normal. Blood pressure increases with age and some medical conditions.  Get evaluated for sleep disorders (sleep apnea). Talk to your health care provider about getting a sleep evaluation if you snore a lot or have excessive sleepiness.  Take over-the-counter and prescription medicines only as told by your health care provider. Aspirin or blood thinners (antiplatelets or anticoagulants) may be recommended to reduce your risk of forming blood clots that can lead to stroke.  Make sure that any other medical conditions you have, such as atrial fibrillation or atherosclerosis, are managed. What are the warning signs of a stroke? The warning signs of a stroke can be easily remembered as BEFAST.  B is for balance. Signs include: ? Dizziness. ? Loss of balance or coordination. ? Sudden trouble walking.  E is for eyes. Signs include: ? A sudden change in vision. ? Trouble seeing.  F is for face. Signs include: ? Sudden weakness or numbness of the face. ? The face or eyelid drooping to one side.  A is for arms. Signs include: ? Sudden weakness or numbness of the arm, usually on one side of the body.  S is for speech. Signs include: ? Trouble speaking (aphasia). ? Trouble understanding.  T is for time. ? These symptoms may represent a serious problem that is an emergency. Do not wait to see if the symptoms will go away. Get medical help right away. Call your local emergency services (911 in the [place] Do not drive yourself to the hospital.  Other signs of stroke may include: ? A sudden,  severe headache with no known cause. ? Nausea or vomiting. ? Seizure. Where to find more information For more information, visit:  American Stroke Association: www.strokeassociation.org  National Stroke Association: www.stroke.org Summary  You can prevent a stroke by eating healthy, exercising, not smoking, limiting alcohol intake, and managing any medical conditions you may have.  Do not use any products that contain nicotine or tobacco, such as cigarettes and e-cigarettes. If you need help quitting, ask your health care provider. It may also be helpful to avoid exposure to secondhand smoke.  Remember BEFAST for warning signs of stroke. Get help right away if you or a loved one has any of these signs. This information is not intended to replace advice given to you by your health care provider. Make sure you discuss any questions you have with your health care provider. Document Revised: 09/19/2017 Document Reviewed: 11/12/2016 Elsevier Patient Education  2021 [employer].

## 2020-11-15 NOTE — Progress Notes (Signed)
[facility] [address] [place]. [place] [address] [phone]       OFFICE FOLLOW-UP NOTE  [name] Date of Birth:  06-Mar-1961 Medical Record Number:  [id number]   HPI: Gabriel Lin is a pleasant 60 year old Caucasian male seen today for initial office follow-up visit following hospital consultation for stroke in November 2021.  History is obtained from the patient, review of electronic medical records and I personally reviewed imaging films in PACS.  He has past medical history for hyperlipidemia, hypertension, obesity, obstructive sleep apnea on CPAP, gastroesophageal reflux disease who presented with sudden onset of slurred speech, drooping of his face and arm numbness on 09/09/2020.  Discussed his symptoms with his brother who is a physician who advised him to take aspirin and discuss with his primary care physician.  He is unable to be seen urgently and was asked to go to urgent care the next day.  He presented outside time window for intervention.  Symptoms had all resolved by the time he was evaluated by telemetry neurology.  CT scan of the head was unremarkable.  MRI scan of the brain showed tiny weakly diffusion positive hyperintensity in the left frontal lobe probably subacute infarct.  MRI of the brain showed no significant large vessel stenosis or occlusion.  MRA of the neck showed 40% left ICA stenosis.  2D echo showed normal ejection fraction.  LDL cholesterol is elevated 142 mg percent hemoglobin A1c was 4.9.  Bilateral lower extremity Dopplers were negative for DVT.  Transcranial Doppler with bubble study showed a small PFO and subsequently TEE was also done which confirmed it.  He was discharged on aspirin and Plavix with plans to do outpatient TEE which subsequently confirmed small PFO.  Patient returned to the emergency room on 09/24/2020 with symptoms of transient left facial numbness and heaviness.  MRI scan was repeated which showed the same abnormality  seen on the previous scan of the left frontal lobe but on retrospect an additional right mid frontal weak diffusion hyperintensity which was present on the previous MRI but had not been read.  He was changed from aspirin and Plavix to aspirin and Brilinta and subsequently had outpatient loop recorder placed and so for paroxysmal A. fib has not been found.  He has met with Dr. Burt Knack interventional cardiologist and the plan is to hold off on PFO closures for at least a few months.  Patient states he is discontinued Brilinta he did have some bruising and some transient shortness of breath which resolved.  He is now on aspirin alone and is tolerating it well without bleeding or bruising.  Is also on Crestor which is tolerating well without muscle aches and pains.  He plans to see his primary care physician for follow-up lipid profile in a few weeks.  He has no new complaints.  ROS:   14 system review of systems is positive for foot pain and injury and all other systems negative  PMH:  Past Medical History:  Diagnosis Date  . Cataract    "early"  . GERD (gastroesophageal reflux disease)   . Hyperlipidemia    no meds taken  . Hypertension   . Sleep apnea    wears CPA  . Stroke ([place]) 09/12/2020  . TIA (transient ischemic attack)     Social History:  Social History   Socioeconomic History  . Marital status: Married    Spouse name: Leda Quail  . Number of children: Not on file  . Years of education:  Not on file  . Highest education level: Not on file  Occupational History  . Not on file  Tobacco Use  . Smoking status: Former Research scientist (life sciences)  . Smokeless tobacco: Never Used  . Tobacco comment: 2012 quit  Vaping Use  . Vaping Use: Some days  Substance and Sexual Activity  . Alcohol use: Not Currently  . Drug use: Never  . Sexual activity: Not on file  Other Topics Concern  . Not on file  Social History Narrative   Lives with wife   Right handed   Drinks 1-2 cups daily caffeine   Social  Determinants of Health   Financial Resource Strain: Not on file  Food Insecurity: Not on file  Transportation Needs: Not on file  Physical Activity: Not on file  Stress: Not on file  Social Connections: Not on file  Intimate Partner Violence: Not on file    Medications:   Current Outpatient Medications on File Prior to Visit  Medication Sig Dispense Refill  . aspirin 81 MG chewable tablet Chew by mouth daily.    Marland Kitchen atenolol (TENORMIN) 50 MG tablet Take 50 mg by mouth daily.     . ergocalciferol (VITAMIN D2) 1.25 MG (50000 UT) capsule Take 50,000 Units by mouth every 7 (seven) days.     Marland Kitchen esomeprazole (NEXIUM) 40 MG capsule Take 1 tablet by mouth daily.    . melatonin 3 MG TABS tablet Take 6 mg by mouth at bedtime as needed (For sleep).    . rosuvastatin (CRESTOR) 5 MG tablet Take 5 mg by mouth daily.    . ticagrelor (BRILINTA) 90 MG TABS tablet Take 1 tablet (90 mg total) by mouth 2 (two) times daily. 60 tablet 0   No current facility-administered medications on file prior to visit.    Allergies:  No Known Allergies  Physical Exam General: Mildly obese middle-aged Caucasian male, seated, in no evident distress Head: head normocephalic and atraumatic.  Neck: supple with no carotid or supraclavicular bruits Cardiovascular: regular rate and rhythm, no murmurs Musculoskeletal: no deformity but wearing a left foot boot for toe injury Skin:  no rash/petichiae Vascular:  Normal pulses all extremities Vitals:   11/15/20 1446  BP: 124/75  Pulse: 72   Neurologic Exam Mental Status: Awake and fully alert. Oriented to place and time. Recent and remote memory intact. Attention span, concentration and fund of knowledge appropriate. Mood and affect appropriate.  Cranial Nerves: Fundoscopic exam reveals sharp disc margins. Pupils equal, briskly reactive to light. Extraocular movements full without nystagmus. Visual fields full to confrontation. Hearing intact. Facial sensation intact. Face,  tongue, palate moves normally and symmetrically.  Motor: Normal bulk and tone. Normal strength in all tested extremity muscles. Sensory.: intact to touch ,pinprick .position and vibratory sensation.  Coordination: Rapid alternating movements normal in all extremities. Finger-to-nose and heel-to-shin performed accurately bilaterally. Gait and Station: Arises from chair without difficulty. Stance is normal. Gait demonstrates normal stride length and balance . Able to heel, toe and tandem walk without difficulty.  Reflexes: 1+ and symmetric. Toes downgoing.   NIHSS  0 Modified Rankin  1   ASSESSMENT: 60 year old Caucasian male with left frontal embolic stroke of cryptogenic etiology in November 2021 as well as small right frontal silent infarct as well.  Vascular risk factors of hypertension, hyperlipidemia, obstructive sleep apnea and a small PFO.     PLAN: I had a long d/w patient about his recent stroke, risk for recurrent stroke/TIAs, personally independently reviewed imaging studies and stroke  evaluation results and answered questions.Continue aspirin 81 mg daily  for secondary stroke prevention and maintain strict control of hypertension with blood pressure goal below 130/90, diabetes with hemoglobin A1c goal below 6.5% and lipids with LDL cholesterol goal below 70 mg/dL. I also advised the patient to eat a healthy diet with plenty of whole grains, cereals, fruits and vegetables, exercise regularly and maintain ideal body weight.  He was counseled to be compliant with using his CPAP for his sleep apnea.  He may consider possible participation in the [place] trial if interested and was given information to review at home and decide.  Followup in the future with me in 6 months or call earlier if necessary. Greater than 50% of time during this 25 minute visit was spent on counseling,explanation of diagnosis, planning of further management, discussion with patient and family and coordination of  care Antony Contras, MD Note: This document was prepared with digital dictation and possible smart phrase technology. Any transcriptional errors that result from this process are unintentional

## 2020-11-16 ENCOUNTER — Ambulatory Visit (INDEPENDENT_AMBULATORY_CARE_PROVIDER_SITE_OTHER): Payer: BC Managed Care – PPO

## 2020-11-16 DIAGNOSIS — G459 Transient cerebral ischemic attack, unspecified: Secondary | ICD-10-CM

## 2020-11-18 LAB — CUP PACEART REMOTE DEVICE CHECK
Date Time Interrogation Session: 20220128185044
Implantable Pulse Generator Implant Date: 20211123

## 2020-11-24 ENCOUNTER — Other Ambulatory Visit: Payer: Self-pay | Admitting: Podiatry

## 2020-11-24 ENCOUNTER — Other Ambulatory Visit: Payer: Self-pay

## 2020-11-24 ENCOUNTER — Ambulatory Visit (INDEPENDENT_AMBULATORY_CARE_PROVIDER_SITE_OTHER): Payer: BC Managed Care – PPO

## 2020-11-24 ENCOUNTER — Ambulatory Visit (INDEPENDENT_AMBULATORY_CARE_PROVIDER_SITE_OTHER): Payer: BC Managed Care – PPO | Admitting: Podiatry

## 2020-11-24 DIAGNOSIS — M7752 Other enthesopathy of left foot: Secondary | ICD-10-CM | POA: Diagnosis not present

## 2020-11-24 DIAGNOSIS — M9272 Juvenile osteochondrosis of metatarsus, left foot: Secondary | ICD-10-CM | POA: Diagnosis not present

## 2020-11-24 MED ORDER — BETAMETHASONE SOD PHOS & ACET 6 (3-3) MG/ML IJ SUSP
6.0000 mg | Freq: Once | INTRAMUSCULAR | Status: AC
Start: 2020-11-24 — End: 2020-11-24
  Administered 2020-11-24: 6 mg

## 2020-11-24 NOTE — Progress Notes (Signed)
  Subjective:  Patient ID: Gabriel Lin, male    DOB: May 12, 1961,  MRN: [id number]  Chief Complaint  Patient presents with  . Foot Injury    Pt states great improvement but admits had a minor setback when he bent his foot wrong. Pt states he did not receive his meloxicam from the pharmacy.   60 y.o. male presents with the above complaint. History confirmed with patient.  Overall doing much better.  Objective:  Physical Exam: warm, good capillary refill, no trophic changes or ulcerative lesions, normal DP and PT pulses and normal sensory exam. Left Foot: Local warmth and severe pain to the dorsal second metatarsal phalangeal joint, some of the third metatarsal phalangeal joint.  No images are attached to the encounter.  Radiographs: X-ray of the left foot: Elongated second and third metatarsals without progressive bone destruction Assessment:   1. Freiberg's disease, left    Plan:  Patient was evaluated and treated and all questions answered.  Left second and third metatarsal phalangeal joint acute capsulitis with symptoms concerning for acute Freiberg infarction -Improving. Continue surgical shoe until swelling completely resolves. -Injection as below  Procedure: Joint Injection Location: Left 2nd MPJ joint Skin Prep: Alcohol. Injectate: 0.5 cc 1% lidocaine plain, 0.5 cc betamethasone acetate-betamethasone sodium phosphate Disposition: Patient tolerated procedure well. Injection site dressed with a band-aid.  Return in about 4 weeks (around 12/22/2020) for Capsulitis.

## 2020-11-25 NOTE — Progress Notes (Signed)
Carelink Summary Report / Loop Recorder 

## 2020-12-18 ENCOUNTER — Ambulatory Visit (INDEPENDENT_AMBULATORY_CARE_PROVIDER_SITE_OTHER): Payer: BC Managed Care – PPO

## 2020-12-18 DIAGNOSIS — I639 Cerebral infarction, unspecified: Secondary | ICD-10-CM | POA: Diagnosis not present

## 2020-12-21 LAB — CUP PACEART REMOTE DEVICE CHECK
Date Time Interrogation Session: 20220302184744
Implantable Pulse Generator Implant Date: 20211123

## 2020-12-22 ENCOUNTER — Ambulatory Visit (INDEPENDENT_AMBULATORY_CARE_PROVIDER_SITE_OTHER): Payer: BC Managed Care – PPO | Admitting: Podiatry

## 2020-12-22 ENCOUNTER — Other Ambulatory Visit: Payer: Self-pay

## 2020-12-22 DIAGNOSIS — M7752 Other enthesopathy of left foot: Secondary | ICD-10-CM | POA: Diagnosis not present

## 2020-12-22 MED ORDER — DEXAMETHASONE SODIUM PHOSPHATE 120 MG/30ML IJ SOLN
4.0000 mg | Freq: Once | INTRAMUSCULAR | Status: AC
  Administered 2020-12-22: 4 mg via INTRA_ARTICULAR

## 2020-12-22 NOTE — Progress Notes (Signed)
Carelink Summary Report / Loop Recorder 

## 2020-12-22 NOTE — Progress Notes (Signed)
  Subjective:  Patient ID: Graesyn Schreifels, male    DOB: 1961/07/10,  MRN: [id number]  Chief Complaint  Patient presents with  . Follow-up    4 week follow up- pt mentioned there is 90% improvement- injection helped with the discomfort he was having- no other changed since last visit- further evaluation    60 y.o. male presents with the above complaint. History confirmed with patient.  Overall doing much better.  Objective:  Physical Exam: warm, good capillary refill, no trophic changes or ulcerative lesions, normal DP and PT pulses and normal sensory exam. Left Foot: Local warmth and severe pain to the dorsal second metatarsal phalangeal joint, some of the third metatarsal phalangeal joint.  No images are attached to the encounter.  Radiographs: X-ray of the left foot: Elongated second and third metatarsals without progressive bone destruction Assessment:   1. Capsulitis of metatarsophalangeal (MTP) joint of left foot    Plan:  Patient was evaluated and treated and all questions answered.  Left second and third metatarsal phalangeal joint acute capsulitis with symptoms concerning for acute Freiberg infarction -Continues to improve, some residual inflammation. Repeat injection as below  Procedure: Joint Injection Location: Left 2nd MP joint Skin Prep: Alcohol. Injectate: 0.5 cc 1% lidocaine plain, 1 cc dexamethasone Disposition: Patient tolerated procedure well. Injection site dressed with a band-aid.  Return if symptoms worsen or fail to improve.

## 2020-12-27 ENCOUNTER — Inpatient Hospital Stay: Payer: Self-pay | Admitting: Neurology

## 2021-01-18 ENCOUNTER — Ambulatory Visit (INDEPENDENT_AMBULATORY_CARE_PROVIDER_SITE_OTHER): Payer: BC Managed Care – PPO

## 2021-01-18 ENCOUNTER — Other Ambulatory Visit: Payer: Self-pay

## 2021-01-18 DIAGNOSIS — I639 Cerebral infarction, unspecified: Secondary | ICD-10-CM | POA: Diagnosis not present

## 2021-01-23 LAB — CUP PACEART REMOTE DEVICE CHECK
Date Time Interrogation Session: 20220404184806
Implantable Pulse Generator Implant Date: 20211123

## 2021-01-30 NOTE — Progress Notes (Signed)
Carelink Summary Report / Loop Recorder 

## 2021-02-19 ENCOUNTER — Ambulatory Visit (INDEPENDENT_AMBULATORY_CARE_PROVIDER_SITE_OTHER): Payer: BC Managed Care – PPO

## 2021-02-19 DIAGNOSIS — I639 Cerebral infarction, unspecified: Secondary | ICD-10-CM

## 2021-03-08 NOTE — Progress Notes (Signed)
Carelink Summary Report / Loop Recorder 

## 2021-03-30 ENCOUNTER — Ambulatory Visit (INDEPENDENT_AMBULATORY_CARE_PROVIDER_SITE_OTHER): Payer: BC Managed Care – PPO

## 2021-03-30 DIAGNOSIS — I639 Cerebral infarction, unspecified: Secondary | ICD-10-CM | POA: Diagnosis not present

## 2021-03-30 LAB — CUP PACEART REMOTE DEVICE CHECK
Date Time Interrogation Session: 20220609184547
Implantable Pulse Generator Implant Date: 20211123

## 2021-04-20 NOTE — Progress Notes (Signed)
Carelink Summary Report / Loop Recorder 

## 2021-05-02 ENCOUNTER — Ambulatory Visit (INDEPENDENT_AMBULATORY_CARE_PROVIDER_SITE_OTHER): Payer: BC Managed Care – PPO

## 2021-05-02 DIAGNOSIS — G459 Transient cerebral ischemic attack, unspecified: Secondary | ICD-10-CM | POA: Diagnosis not present

## 2021-05-02 LAB — CUP PACEART REMOTE DEVICE CHECK
Date Time Interrogation Session: 20220712184657
Implantable Pulse Generator Implant Date: 20211123

## 2021-05-15 ENCOUNTER — Ambulatory Visit: Payer: BC Managed Care – PPO | Admitting: Neurology

## 2021-05-24 NOTE — Progress Notes (Signed)
Carelink Summary Report / Loop Recorder 

## 2021-06-04 ENCOUNTER — Ambulatory Visit (INDEPENDENT_AMBULATORY_CARE_PROVIDER_SITE_OTHER): Payer: BC Managed Care – PPO

## 2021-06-04 DIAGNOSIS — G459 Transient cerebral ischemic attack, unspecified: Secondary | ICD-10-CM

## 2021-06-04 LAB — CUP PACEART REMOTE DEVICE CHECK
Date Time Interrogation Session: 20220814184659
Implantable Pulse Generator Implant Date: 20211123

## 2021-06-11 ENCOUNTER — Other Ambulatory Visit: Payer: Self-pay

## 2021-06-11 ENCOUNTER — Ambulatory Visit (AMBULATORY_SURGERY_CENTER): Payer: BC Managed Care – PPO | Admitting: *Deleted

## 2021-06-11 VITALS — Ht 72.0 in | Wt 236.0 lb

## 2021-06-11 DIAGNOSIS — Z86010 Personal history of colonic polyps: Secondary | ICD-10-CM

## 2021-06-11 DIAGNOSIS — Z800 Family history of malignant neoplasm of digestive organs: Secondary | ICD-10-CM

## 2021-06-11 NOTE — Progress Notes (Signed)
No egg or soy allergy known to patient  No issues with past sedation with any surgeries or procedures Patient denies ever being told they had issues or difficulty with intubation  No FH of Malignant Hyperthermia No diet pills per patient No home 02 use per patient  No blood thinners per patient  Pt denies issues with constipation  No A fib or A flutter - has loop recorder but never had A Fib  EMMI video to pt or via MyChart  COVID 19 guidelines implemented in PV today with Pt and RN   Pt has a PFO- needs special IV tubing - pt has a loop recorder, no ICS or pacemaker   Pt is fully vaccinated  for Covid   Due to the COVID-19 pandemic we are asking patients to follow certain guidelines.  Pt aware of COVID protocols and [facility] guidelines   Pt verified name, DOB, address and insurance during PV today.  Pt mailed instruction packet of Emmi video, copy of consent form to read and not return, and instructions. PV completed over the phone.  Pt encouraged to call with questions or issues.  My Chart instructions to pt as well

## 2021-06-22 NOTE — Progress Notes (Signed)
Carelink Summary Report / Loop Recorder 

## 2021-07-06 ENCOUNTER — Encounter: Payer: Self-pay | Admitting: Internal Medicine

## 2021-07-09 ENCOUNTER — Ambulatory Visit (INDEPENDENT_AMBULATORY_CARE_PROVIDER_SITE_OTHER): Payer: BC Managed Care – PPO

## 2021-07-09 DIAGNOSIS — G459 Transient cerebral ischemic attack, unspecified: Secondary | ICD-10-CM

## 2021-07-10 LAB — CUP PACEART REMOTE DEVICE CHECK
Date Time Interrogation Session: 20220916184921
Implantable Pulse Generator Implant Date: 20211123

## 2021-07-12 NOTE — Progress Notes (Signed)
Carelink Summary Report / Loop Recorder 

## 2021-07-19 ENCOUNTER — Ambulatory Visit (AMBULATORY_SURGERY_CENTER): Payer: BC Managed Care – PPO | Admitting: Internal Medicine

## 2021-07-19 ENCOUNTER — Other Ambulatory Visit: Payer: Self-pay

## 2021-07-19 ENCOUNTER — Encounter: Payer: Self-pay | Admitting: Internal Medicine

## 2021-07-19 VITALS — BP 121/66 | HR 66 | Temp 98.2°F | Resp 12 | Ht 72.0 in | Wt 243.0 lb

## 2021-07-19 DIAGNOSIS — D124 Benign neoplasm of descending colon: Secondary | ICD-10-CM

## 2021-07-19 DIAGNOSIS — Z800 Family history of malignant neoplasm of digestive organs: Secondary | ICD-10-CM

## 2021-07-19 DIAGNOSIS — Z86010 Personal history of colonic polyps: Secondary | ICD-10-CM | POA: Diagnosis present

## 2021-07-19 DIAGNOSIS — D123 Benign neoplasm of transverse colon: Secondary | ICD-10-CM | POA: Diagnosis not present

## 2021-07-19 DIAGNOSIS — D125 Benign neoplasm of sigmoid colon: Secondary | ICD-10-CM | POA: Diagnosis not present

## 2021-07-19 DIAGNOSIS — D128 Benign neoplasm of rectum: Secondary | ICD-10-CM | POA: Diagnosis not present

## 2021-07-19 DIAGNOSIS — D127 Benign neoplasm of rectosigmoid junction: Secondary | ICD-10-CM

## 2021-07-19 MED ORDER — SODIUM CHLORIDE 0.9 % IV SOLN: 500.0000 mL | Freq: Once | INTRAVENOUS | Status: DC

## 2021-07-19 MED ORDER — SODIUM CHLORIDE 0.9 % IV SOLN
500.0000 mL | Freq: Once | INTRAVENOUS | Status: DC
Start: 2021-07-19 — End: 2021-07-19

## 2021-07-19 NOTE — Patient Instructions (Addendum)
I found and removed 5 polyps today - all small and look benign. I will let you know pathology results and when to have another routine colonoscopy by mail and/or My Chart.  I appreciate the opportunity to care for you. Gatha Mayer, MD, FACG    YOU HAD AN ENDOSCOPIC PROCEDURE TODAY AT [facility]:   Refer to the procedure report that was given to you for any specific questions about what was found during the examination.  If the procedure report does not answer your questions, please call your gastroenterologist to clarify.  If you requested that your care partner not be given the details of your procedure findings, then the procedure report has been included in a sealed envelope for you to review at your convenience later.  YOU SHOULD EXPECT: Some feelings of bloating in the abdomen. Passage of more gas than usual.  Walking can help get rid of the air that was put into your GI tract during the procedure and reduce the bloating. If you had a lower endoscopy (such as a colonoscopy or flexible sigmoidoscopy) you may notice spotting of blood in your stool or on the toilet paper. If you underwent a bowel prep for your procedure, you may not have a normal bowel movement for a few days.  Please Note:  You might notice some irritation and congestion in your nose or some drainage.  This is from the oxygen used during your procedure.  There is no need for concern and it should clear up in a day or so.  SYMPTOMS TO REPORT IMMEDIATELY:  Following lower endoscopy (colonoscopy or flexible sigmoidoscopy):  Excessive amounts of blood in the stool  Significant tenderness or worsening of abdominal pains  Swelling of the abdomen that is new, acute  Fever of 100F or higher  For urgent or emergent issues, a gastroenterologist can be reached at any hour by calling [phone]. Do not use MyChart messaging for urgent concerns.    DIET:  We do recommend a small meal at first, but then you  may proceed to your regular diet.  Drink plenty of fluids but you should avoid alcoholic beverages for 24 hours.  ACTIVITY:  You should plan to take it easy for the rest of today and you should NOT DRIVE or use heavy machinery until tomorrow (because of the sedation medicines used during the test).    FOLLOW UP: Our staff will call the number listed on your records 48-72 hours following your procedure to check on you and address any questions or concerns that you may have regarding the information given to you following your procedure. If we do not reach you, we will leave a message.  We will attempt to reach you two times.  During this call, we will ask if you have developed any symptoms of COVID 19. If you develop any symptoms (ie: fever, flu-like symptoms, shortness of breath, cough etc.) before then, please call [phone].  If you test positive for Covid 19 in the 2 weeks post procedure, please call and report this information to [place].    If any biopsies were taken you will be contacted by phone or by letter within the next 1-3 weeks.  Please call us at [phone] if you have not heard about the biopsies in 3 weeks.    SIGNATURES/CONFIDENTIALITY: You and/or your care partner have signed paperwork which will be entered into your electronic medical record.  These signatures attest to the fact that that the  information above on your After Visit Summary has been reviewed and is understood.  Full responsibility of the confidentiality of this discharge information lies with you and/or your care-partner.

## 2021-07-19 NOTE — Op Note (Addendum)
[facility] Patient Name: Gabriel Lin Procedure Date: 07/19/2021 11:11 AM MRN: [id number] Endoscopist: Gatha Mayer , MD Age: 60 Referring MD:  Date of Birth: 1961-09-29 Gender: Male Account #: [id number] Procedure:                Colonoscopy Indications:              Surveillance: History of numerous (> 10) adenomas                            on last colonoscopy (< 3 yrs) Medicines:                Propofol per Anesthesia, Monitored Anesthesia Care Procedure:                Pre-Anesthesia Assessment:                           - Prior to the procedure, a History and Physical                            was performed, and patient medications and                            allergies were reviewed. The patient's tolerance of                            previous anesthesia was also reviewed. The risks                            and benefits of the procedure and the sedation                            options and risks were discussed with the patient.                            All questions were answered, and informed consent                            was obtained. Prior Anticoagulants: The patient has                            taken no previous anticoagulant or antiplatelet                            agents. ASA Grade Assessment: III - A patient with                            severe systemic disease. After reviewing the risks                            and benefits, the patient was deemed in                            satisfactory condition to undergo the procedure.  After obtaining informed consent, the colonoscope                            was passed under direct vision. Throughout the                            procedure, the patient's blood pressure, pulse, and                            oxygen saturations were monitored continuously. The                            CF [id number] #[id number] was introduced through the anus                             and advanced to the the cecum, identified by                            appendiceal orifice and ileocecal valve. The                            colonoscopy was performed without difficulty. The                            patient tolerated the procedure well. The quality                            of the bowel preparation was good. The ileocecal                            valve, appendiceal orifice, and rectum were                            photographed. The bowel preparation used was                            Miralax via split dose instruction. Scope In: 11:22:25 AM Scope Out: 11:37:54 AM Scope Withdrawal Time: 0 hours 13 minutes 50 seconds  Total Procedure Duration: 0 hours 15 minutes 29 seconds  Findings:                 The perianal and digital rectal examinations were                            normal.                           Five sessile polyps were found in the rectum,                            sigmoid colon, descending colon and transverse                            colon. The polyps were diminutive in size. These  polyps were removed with a cold snare. Resection                            and retrieval were complete. Verification of                            patient identification for the specimen was done.                            Estimated blood loss was minimal.                           Multiple small and large-mouthed diverticula were                            found in the sigmoid colon.                           The exam was otherwise without abnormality on                            direct and retroflexion views. Complications:            No immediate complications. Estimated Blood Loss:     Estimated blood loss was minimal. Impression:               - Five diminutive polyps in the rectum, in the                            sigmoid colon, in the descending colon and in the                            transverse colon, removed with a cold  snare.                            Resected and retrieved.                           - Diverticulosis in the sigmoid colon.                           - The examination was otherwise normal on direct                            and retroflexion views.                           - Personal history of colonic polyps. 12 adenomas                            06/2020                           Also has FHx CRCA brother < 60 - He had a "flat  polyp" but not what sounds like polyposis Recommendation:           - Patient has a contact number available for                            emergencies. The signs and symptoms of potential                            delayed complications were discussed with the                            patient. Return to normal activities tomorrow.                            Written discharge instructions were provided to the                            patient.                           - Resume previous diet.                           - Continue present medications.                           - Repeat colonoscopy is recommended for                            surveillance. The colonoscopy date will be                            determined after pathology results from today's                            exam become available for review.                           - anticipate genetics eval pending path review Gatha Mayer, MD 07/19/2021 11:46:40 AM This report has been signed electronically.

## 2021-07-19 NOTE — Progress Notes (Signed)
Called to room to assist during endoscopic procedure.  Patient ID and intended procedure confirmed with present staff. Received instructions for my participation in the procedure from the performing physician.  

## 2021-07-19 NOTE — Progress Notes (Signed)
[facility] History and Physical   Primary Care Physician:  Galen Manila, MD   Reason for Procedure:   Hx colon polyps  Plan:    colonoscopy     HPI: Gabriel Lin is a 60 y.o. male here for a surveillance colonoscopy exam w/ hx colon polyps and F Hx CRCA   Past Medical History:  Diagnosis Date   Cataract    "early"   Chronic kidney disease    stage III- stable   GERD (gastroesophageal reflux disease)    Hyperlipidemia    no meds taken   Hypertension    PFO (patent foramen ovale)    Sleep apnea    wears CPA   Stroke ([place]) 09/12/2020   TIA (transient ischemic attack)     Past Surgical History:  Procedure Laterality Date   BUBBLE STUDY  09/12/2020   Procedure: BUBBLE STUDY;  Surgeon: Buford Dresser, MD;  Location: [place];  Service: Cardiovascular;;   COLONOSCOPY     HERNIA REPAIR     double hernia repair- 2005 ?   LAMINECTOMY     LOOP RECORDER INSERTION N/A 09/12/2020   Procedure: LOOP RECORDER INSERTION;  Surgeon: [name], MD;  Location: [place] [facility];  Service: Cardiovascular;  Laterality: N/A;   POLYPECTOMY     TEE WITHOUT CARDIOVERSION N/A 09/12/2020   Procedure: TRANSESOPHAGEAL ECHOCARDIOGRAM (TEE);  Surgeon: Buford Dresser, MD;  Location: [facility] ENDOSCOPY;  Service: Cardiovascular;  Laterality: N/A;    Prior to Admission medications   Medication Sig Start Date End Date Taking? Authorizing Provider  aspirin 81 MG chewable tablet Chew by mouth daily.   Yes [provider]  atenolol (TENORMIN) 50 MG tablet Take 50 mg by mouth daily.  10/13/19  Yes [provider]  co-enzyme Q-10 30 MG capsule Take by mouth. 11/23/20  Yes [provider]  ergocalciferol (VITAMIN D2) 1.25 MG (50000 UT) capsule Take 50,000 Units by mouth every 7 (seven) days.  07/21/19  Yes [provider]  esomeprazole (NEXIUM) 20 MG capsule Take 20 mg by mouth daily at 12 noon.   Yes [provider]   rosuvastatin (CRESTOR) 5 MG tablet Take 5 mg by mouth daily.   Yes [provider]  ALPRAZolam Duanne Moron) 0.25 MG tablet Take by mouth. 09/29/20   [provider]    Current Outpatient Medications  Medication Sig Dispense Refill   aspirin 81 MG chewable tablet Chew by mouth daily.     atenolol (TENORMIN) 50 MG tablet Take 50 mg by mouth daily.      co-enzyme Q-10 30 MG capsule Take by mouth.     ergocalciferol (VITAMIN D2) 1.25 MG (50000 UT) capsule Take 50,000 Units by mouth every 7 (seven) days.      esomeprazole (NEXIUM) 20 MG capsule Take 20 mg by mouth daily at 12 noon.     rosuvastatin (CRESTOR) 5 MG tablet Take 5 mg by mouth daily.     ALPRAZolam (XANAX) 0.25 MG tablet Take by mouth.     Current Facility-Administered Medications  Medication Dose Route Frequency Provider Last Rate Last Admin   0.9 %  sodium chloride infusion  500 mL Intravenous Once Gatha Mayer, MD        Allergies as of 07/19/2021   (No Known Allergies)    Family History  Problem Relation Age of Onset   Colon cancer Brother 107   Colon polyps Brother    Esophageal cancer Neg Hx    Rectal cancer Neg  Hx    Stomach cancer Neg Hx     Social History   Socioeconomic History   Marital status: Married    Spouse name: Darby                   Tobacco Use   Smoking status: Former   Smokeless tobacco: Never   Tobacco comments:    2012 quit  Vaping Use   Vaping Use: Some days  Substance and Sexual Activity   Alcohol use: Not Currently   Drug use: Never            Social History Narrative   Lives with wife   Right handed   Drinks 1-2 cups daily caffeine      Review of Systems:  All other review of systems negative except as mentioned in the HPI.  Physical Exam: Vital signs BP (!) 155/94   Pulse 76   Temp 98.2 F (36.8 C) (Temporal)   Ht 6' (1.829 m)   Wt 243 lb (110.2 kg)   SpO2 99%   BMI 32.96 kg/m   General:   Alert,  Well-developed, well-nourished,  pleasant and cooperative in NAD Lungs:  Clear throughout to auscultation.   Heart:  Regular rate and rhythm; no murmurs, clicks, rubs,  or gallops. Abdomen:  Soft, nontender and nondistended. Normal bowel sounds.   Neuro/Psych:  Alert and cooperative. Normal mood and affect. A and O x 3   @Khyran Riera  Simonne Maffucci, MD, [facility] [phone] (pager) 07/19/2021 11:14 AM@

## 2021-07-19 NOTE — Progress Notes (Signed)
DT-VS Pt's states no medical or surgical changes since previsit or office visit.

## 2021-07-23 ENCOUNTER — Telehealth: Payer: Self-pay

## 2021-07-23 NOTE — Telephone Encounter (Signed)
  Follow up Call-  Call back number 07/19/2021 06/23/2020  Post procedure Call Back phone  # [phone]  Permission to leave phone message Yes Yes  Some recent data might be hidden     Patient questions:  Do you have a fever, pain , or abdominal swelling? No. Pain Score  0 *  Have you tolerated food without any problems? Yes.    Have you been able to return to your normal activities? Yes.    Do you have any questions about your discharge instructions: Diet   No. Medications  No. Follow up visit  No.  Do you have questions or concerns about your Care? No.  Actions: * If pain score is 4 or above: No action needed, pain <4.  Have you developed a fever since your procedure? no  2.   Have you had an respiratory symptoms (SOB or cough) since your procedure? no  3.   Have you tested positive for COVID 19 since your procedure no  4.   Have you had any family members/close contacts diagnosed with the COVID 19 since your procedure?  no   If yes to any of these questions please route to Joylene Nashaun, RN and Joella Prince, RN

## 2021-07-26 ENCOUNTER — Encounter: Payer: Self-pay | Admitting: Internal Medicine

## 2021-08-13 ENCOUNTER — Ambulatory Visit (INDEPENDENT_AMBULATORY_CARE_PROVIDER_SITE_OTHER): Payer: BC Managed Care – PPO

## 2021-08-13 DIAGNOSIS — I639 Cerebral infarction, unspecified: Secondary | ICD-10-CM

## 2021-08-13 LAB — CUP PACEART REMOTE DEVICE CHECK
Date Time Interrogation Session: 20221019184918
Implantable Pulse Generator Implant Date: 20211123

## 2021-08-20 NOTE — Progress Notes (Signed)
Carelink Summary Report / Loop Recorder 

## 2021-08-24 ENCOUNTER — Telehealth: Payer: Self-pay | Admitting: Hematology and Oncology

## 2021-08-24 NOTE — Telephone Encounter (Signed)
Scheduled appt per 11/3 referral. Pt is aware of appt date and time.  

## 2021-09-02 NOTE — Progress Notes (Signed)
[place] Telephone:([phone]) [phone]   Fax:(336) [place] NOTE  Patient Care Team: Galen Manila, MD as PCP - General (Internal Medicine)  Hematological/Oncological History # Polycythemia 04/08/2008: Hgb 18.7, Hct 55.0  09/24/2020: WBC 7.1, Hgb 18.4, MCV 92, Plt 179 09/03/2021: establish care with Dr. Lorenso Courier   CHIEF COMPLAINTS/PURPOSE OF CONSULTATION:  "Polycythemia "  HISTORY OF PRESENTING ILLNESS:  Gabriel Lin 60 y.o. male with medical history significant for CKD, HLD, HTN, sleep apnea on CPAP and TIA who presents for evaluation of polycythemia.   On review of the previous records Gabriel Lin has a longstanding history of polycythemia dating back to at least 04/08/2008.  At that time he was found to have a hemoglobin of 18.7.  He has consistently had hemoglobins greater than 17 with his last measurement in our system being hemoglobin of 18.0 on 09/24/2020.  Due to concern for his persistent polycythemia the patient was referred to hematology for further evaluation and management.  On exam today Gabriel Lin is accompanied by his wife.  Today he notes that he has been a smoker for 25 years and quit smoking about 10 to 12 years ago.  Instead is taken up vaping and reports that he quit vaping about 24 hours ago.  He notes that he needed the vaping in order to help him quit smoking.  He notes that about 2 months ago he noticed a change in the "vape juice" and suddenly developed a sore throat and more coughing.  He is deeply concerned that this may be damaging to his lungs.  On further discussion he reports that he does carry a diagnosis of obstructive sleep apnea and he has multiple CPAP machines.  He reports he cannot sleep without it and has been faithful in using it.  He notes that he has not had any adjustments on the machine since he first started using it 15 years ago.  He notes that he has also never used any testosterone containing products.  He does  endorse about a year ago having had a TIA with no residual deficits.  He has been on aspirin since that time.  The etiology of this was thought to be a PFO.  The patient reports that his family history is remarkable for stage I colon cancer in his brother and type 2 diabetes in his sister.  He notes that his mother passed away of old age and his father is 80 years old and still alive and well.  The patient rarely drinks any alcohol and is currently a Programmer, applications for [employer].  He currently denies any fevers, chills, sweats, nausea, vomiting or diarrhea.  Full 10 point ROS is listed below.  MEDICAL HISTORY:  Past Medical History:  Diagnosis Date   Cataract    "early"   Chronic kidney disease    stage III- stable   COVID    GERD (gastroesophageal reflux disease)    Hyperlipidemia    no meds taken   Hypertension    PFO (patent foramen ovale)    Sleep apnea    wears CPA   Stroke ([place]) 09/12/2020   TIA (transient ischemic attack)     SURGICAL HISTORY: Past Surgical History:  Procedure Laterality Date   BUBBLE STUDY  09/12/2020   Procedure: BUBBLE STUDY;  Surgeon: Buford Dresser, MD;  Location: [place];  Service: Cardiovascular;;   COLONOSCOPY     HERNIA REPAIR     double hernia repair- 2005 ?  LAMINECTOMY     LOOP RECORDER INSERTION N/A 09/12/2020   Procedure: LOOP RECORDER INSERTION;  Surgeon: [name], MD;  Location: [place] [facility];  Service: Cardiovascular;  Laterality: N/A;   POLYPECTOMY     TEE WITHOUT CARDIOVERSION N/A 09/12/2020   Procedure: TRANSESOPHAGEAL ECHOCARDIOGRAM (TEE);  Surgeon: Buford Dresser, MD;  Location: [facility] ENDOSCOPY;  Service: Cardiovascular;  Laterality: N/A;    SOCIAL HISTORY: Social History   Socioeconomic History   Marital status: Married    Spouse name: Darby   Number of children: Not on file   Years of education: Not on file   Highest education level: Not on file  Occupational History   Not  on file  Tobacco Use   Smoking status: Former   Smokeless tobacco: Never   Tobacco comments:    2012 quit  Vaping Use   Vaping Use: Some days  Substance and Sexual Activity   Alcohol use: Not Currently   Drug use: Never   Sexual activity: Not on file  Other Topics Concern   Not on file  Social History Narrative   Lives with wife   Right handed   Drinks 1-2 cups daily caffeine   Social Determinants of Health   Financial Resource Strain: Not on file  Food Insecurity: Not on file  Transportation Needs: Not on file  Physical Activity: Not on file  Stress: Not on file  Social Connections: Not on file  Intimate Partner Violence: Not on file    FAMILY HISTORY: Family History  Problem Relation Age of Onset   Colon cancer Brother 21   Colon polyps Brother    Esophageal cancer Neg Hx    Rectal cancer Neg Hx    Stomach cancer Neg Hx     ALLERGIES:  has No Known Allergies.  MEDICATIONS:  Current Outpatient Medications  Medication Sig Dispense Refill   ALPRAZolam (XANAX) 0.25 MG tablet Take by mouth. Taking 1/2 tablet PO as needed     aspirin 81 MG chewable tablet Chew by mouth daily.     Aspirin-Acetaminophen-Caffeine (GOODY HEADACHE PO) Take by mouth. As needed     atenolol (TENORMIN) 50 MG tablet Take 50 mg by mouth daily.      co-enzyme Q-10 30 MG capsule Take by mouth.     ergocalciferol (VITAMIN D2) 1.25 MG (50000 UT) capsule Take 50,000 Units by mouth every 7 (seven) days.      esomeprazole (NEXIUM) 20 MG capsule Take 20 mg by mouth daily at 12 noon.     rosuvastatin (CRESTOR) 5 MG tablet Take 5 mg by mouth daily.     No current facility-administered medications for this visit.    REVIEW OF SYSTEMS:   Constitutional: ( - ) fevers, ( - )  chills , ( - ) night sweats Eyes: ( - ) blurriness of vision, ( - ) double vision, ( - ) watery eyes Ears, nose, mouth, throat, and face: ( - ) mucositis, ( - ) sore throat Respiratory: ( - ) cough, ( - ) dyspnea, ( - )  wheezes Cardiovascular: ( - ) palpitation, ( - ) chest discomfort, ( - ) lower extremity swelling Gastrointestinal:  ( - ) nausea, ( - ) heartburn, ( - ) change in bowel habits Skin: ( - ) abnormal skin rashes Lymphatics: ( - ) new lymphadenopathy, ( - ) easy bruising Neurological: ( - ) numbness, ( - ) tingling, ( - ) new weaknesses Behavioral/Psych: ( - ) mood change, ( - )  new changes  All other systems were reviewed with the patient and are negative.  PHYSICAL EXAMINATION: ECOG PERFORMANCE STATUS: 1 - Symptomatic but completely ambulatory  Vitals:   09/03/21 0907  BP: (!) 147/90  Pulse: 75  Resp: 17  Temp: 98.1 F (36.7 C)  SpO2: 100%   Filed Weights   09/03/21 0907  Weight: 236 lb 1.6 oz (107.1 kg)    GENERAL: well appearing middle-aged Caucasian male in NAD  SKIN: skin color, texture, turgor are normal, no rashes or significant lesions EYES: conjunctiva are pink and non-injected, sclera clear LUNGS: clear to auscultation and percussion with normal breathing effort HEART: regular rate & rhythm and no murmurs and no lower extremity edema Musculoskeletal: no cyanosis of digits and no clubbing  PSYCH: alert & oriented x 3, fluent speech NEURO: no focal motor/sensory deficits  LABORATORY DATA:  I have reviewed the data as listed CBC Latest Ref Rng & Units 09/24/2020 09/24/2020 09/10/2020  WBC 4.0 - 10.5 K/uL - 7.1 10.4  Hemoglobin 13.0 - 17.0 g/dL 18.0(H) 18.4(H) 17.9(H)  Hematocrit 39.0 - 52.0 % 53.0(H) 54.9(H) 52.4(H)  Platelets 150 - 400 K/uL - 179 195    CMP Latest Ref Rng & Units 09/24/2020 09/24/2020 09/12/2020  Glucose 70 - 99 mg/dL 94 92 104(H)  BUN 8 - 23 mg/dL 30(H) 27(H) 28(H)  Creatinine 0.61 - 1.24 mg/dL 1.40(H) 1.45(H) 1.71(H)  Sodium 135 - 145 mmol/L 136 135 137  Potassium 3.5 - 5.1 mmol/L 4.2 4.2 4.2  Chloride 98 - 111 mmol/L 106 105 104  CO2 22 - 32 mmol/L - 18(L) 22  Calcium 8.9 - 10.3 mg/dL - 8.9 8.9  Total Protein 6.5 - 8.1 g/dL - 7.4 -  Total  Bilirubin 0.3 - 1.2 mg/dL - 1.1 -  Alkaline Phos 38 - 126 U/L - 71 -  AST 15 - 41 U/L - 24 -  ALT 0 - 44 U/L - 35 -    RADIOGRAPHIC STUDIES: CUP PACEART REMOTE DEVICE CHECK  Result Date: 08/13/2021 ILR summary report received. Battery status OK. Normal device function. No new symptom, tachy, brady, or pause episodes. No new AF episodes. Monthly summary reports and ROV/PRN LR   ASSESSMENT & PLAN Gabriel Lin 60 y.o. male with medical history significant for CKD, HLD, HTN, sleep apnea on CPAP and TIA who presents for evaluation of polycythemia.   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  At this time we will complete a full polycythemia work-up to include erythropoietin levels, CBC, as well as JAK2 with reflex and BCR/ABL FISH.  At this time would also encourage the patient to reconnect with sleep medicine to make sure his CPAP machine is titrated appropriately.  The patient voices understanding of this plan moving forward.  We will plan to see him back in 6 months time or sooner if there are concerning findings on the MPN panels.  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  Patient is compliant with his CPAP machine but it may need adjusted --Recommend patient reconnect with his sleep medicine physician in order to assure his CPAP machine is appropriately titrated --Today we will order labs including erythropoietin, CBC, CMP, as well as JAK2 with reflex and BCR/ABL FISH -- Return to clinic in 6 months time or sooner if there are concerning findings on  the MPN panels.  No orders of the defined types were placed in this encounter.   All questions were answered. The patient knows to call the clinic with any problems, questions or concerns.  A total of more than 60 minutes were spent on  this encounter with face-to-face time and non-face-to-face time, including preparing to see the patient, ordering tests and/or medications, counseling the patient and coordination of care as outlined above.   Ledell Peoples, MD Department of Hematology/Oncology [facility] Phone: [phone] Pager: [phone] Email: [email or web address]  09/03/2021 9:21 AM

## 2021-09-03 ENCOUNTER — Other Ambulatory Visit: Payer: Self-pay

## 2021-09-03 ENCOUNTER — Encounter: Payer: Self-pay | Admitting: Hematology and Oncology

## 2021-09-03 ENCOUNTER — Inpatient Hospital Stay: Payer: BC Managed Care – PPO | Attending: Hematology and Oncology | Admitting: Hematology and Oncology

## 2021-09-03 ENCOUNTER — Inpatient Hospital Stay: Payer: BC Managed Care – PPO

## 2021-09-03 VITALS — BP 147/90 | HR 75 | Temp 98.1°F | Resp 17 | Wt 236.1 lb

## 2021-09-03 DIAGNOSIS — I129 Hypertensive chronic kidney disease with stage 1 through stage 4 chronic kidney disease, or unspecified chronic kidney disease: Secondary | ICD-10-CM | POA: Diagnosis not present

## 2021-09-03 DIAGNOSIS — Z79899 Other long term (current) drug therapy: Secondary | ICD-10-CM | POA: Insufficient documentation

## 2021-09-03 DIAGNOSIS — E785 Hyperlipidemia, unspecified: Secondary | ICD-10-CM | POA: Diagnosis not present

## 2021-09-03 DIAGNOSIS — G4733 Obstructive sleep apnea (adult) (pediatric): Secondary | ICD-10-CM

## 2021-09-03 DIAGNOSIS — K219 Gastro-esophageal reflux disease without esophagitis: Secondary | ICD-10-CM | POA: Diagnosis not present

## 2021-09-03 DIAGNOSIS — Z833 Family history of diabetes mellitus: Secondary | ICD-10-CM | POA: Insufficient documentation

## 2021-09-03 DIAGNOSIS — Z8673 Personal history of transient ischemic attack (TIA), and cerebral infarction without residual deficits: Secondary | ICD-10-CM | POA: Insufficient documentation

## 2021-09-03 DIAGNOSIS — Z800 Family history of malignant neoplasm of digestive organs: Secondary | ICD-10-CM | POA: Insufficient documentation

## 2021-09-03 DIAGNOSIS — G4730 Sleep apnea, unspecified: Secondary | ICD-10-CM | POA: Insufficient documentation

## 2021-09-03 DIAGNOSIS — D751 Secondary polycythemia: Secondary | ICD-10-CM | POA: Insufficient documentation

## 2021-09-03 DIAGNOSIS — Z87891 Personal history of nicotine dependence: Secondary | ICD-10-CM | POA: Diagnosis not present

## 2021-09-03 DIAGNOSIS — N189 Chronic kidney disease, unspecified: Secondary | ICD-10-CM | POA: Insufficient documentation

## 2021-09-03 DIAGNOSIS — Z7982 Long term (current) use of aspirin: Secondary | ICD-10-CM | POA: Insufficient documentation

## 2021-09-03 DIAGNOSIS — Z8616 Personal history of COVID-19: Secondary | ICD-10-CM | POA: Diagnosis not present

## 2021-09-03 LAB — CMP (CANCER CENTER ONLY)
ALT: 31 U/L (ref 0–44)
AST: 22 U/L (ref 15–41)
Albumin: 4.3 g/dL (ref 3.5–5.0)
Alkaline Phosphatase: 81 U/L (ref 38–126)
Anion gap: 11 (ref 5–15)
BUN: 29 mg/dL — ABNORMAL HIGH (ref 8–23)
CO2: 22 mmol/L (ref 22–32)
Calcium: 9.4 mg/dL (ref 8.9–10.3)
Chloride: 105 mmol/L (ref 98–111)
Creatinine: 1.51 mg/dL — ABNORMAL HIGH (ref 0.61–1.24)
GFR, Estimated: 52 mL/min — ABNORMAL LOW (ref >60)
Glucose, Bld: 97 mg/dL (ref 70–99)
Potassium: 4.5 mmol/L (ref 3.5–5.1)
Sodium: 138 mmol/L (ref 135–145)
Total Bilirubin: 0.8 mg/dL (ref 0.3–1.2)
Total Protein: 7.4 g/dL (ref 6.5–8.1)

## 2021-09-03 LAB — CBC WITH DIFFERENTIAL (CANCER CENTER ONLY)
Abs Immature Granulocytes: 0.1 10*3/uL — ABNORMAL HIGH (ref 0.00–0.07)
Basophils Absolute: 0.1 10*3/uL (ref 0.0–0.1)
Basophils Relative: 1 %
Eosinophils Absolute: 0.2 10*3/uL (ref 0.0–0.5)
Eosinophils Relative: 3 %
HCT: 49 % (ref 39.0–52.0)
Hemoglobin: 16.9 g/dL (ref 13.0–17.0)
Immature Granulocytes: 2 %
Lymphocytes Relative: 16 %
Lymphs Abs: 1 10*3/uL (ref 0.7–4.0)
MCH: 30.4 pg (ref 26.0–34.0)
MCHC: 34.5 g/dL (ref 30.0–36.0)
MCV: 88.1 fL (ref 80.0–100.0)
Monocytes Absolute: 0.7 10*3/uL (ref 0.1–1.0)
Monocytes Relative: 11 %
Neutro Abs: 4.2 10*3/uL (ref 1.7–7.7)
Neutrophils Relative %: 67 %
Platelet Count: 147 10*3/uL — ABNORMAL LOW (ref 150–400)
RBC: 5.56 MIL/uL (ref 4.22–5.81)
RDW: 13 % (ref 11.5–15.5)
WBC Count: 6.3 10*3/uL (ref 4.0–10.5)
nRBC: 0 % (ref 0.0–0.2)

## 2021-09-05 LAB — ERYTHROPOIETIN: Erythropoietin: 10.4 m[IU]/mL (ref 2.6–18.5)

## 2021-09-10 LAB — BCR ABL1 FISH (GENPATH)

## 2021-09-10 LAB — JAK2 (INCLUDING V617F AND EXON 12), MPL,& CALR W/RFL MPN PANEL (NGS)

## 2021-09-11 ENCOUNTER — Telehealth: Payer: Self-pay | Admitting: Hematology and Oncology

## 2021-09-11 NOTE — Telephone Encounter (Signed)
Sch per 11/14 los, pt aware 

## 2021-09-17 ENCOUNTER — Encounter: Payer: Self-pay | Admitting: Hematology and Oncology

## 2021-09-17 ENCOUNTER — Ambulatory Visit (INDEPENDENT_AMBULATORY_CARE_PROVIDER_SITE_OTHER): Payer: BC Managed Care – PPO

## 2021-09-17 DIAGNOSIS — G459 Transient cerebral ischemic attack, unspecified: Secondary | ICD-10-CM

## 2021-09-17 LAB — CUP PACEART REMOTE DEVICE CHECK
Date Time Interrogation Session: 20221121184712
Implantable Pulse Generator Implant Date: 20211123

## 2021-09-24 NOTE — Progress Notes (Signed)
Carelink Summary Report / Loop Recorder 

## 2021-10-16 ENCOUNTER — Ambulatory Visit (INDEPENDENT_AMBULATORY_CARE_PROVIDER_SITE_OTHER): Payer: Self-pay

## 2021-10-16 DIAGNOSIS — G459 Transient cerebral ischemic attack, unspecified: Secondary | ICD-10-CM

## 2021-10-16 LAB — CUP PACEART REMOTE DEVICE CHECK
Date Time Interrogation Session: 20221224184847
Implantable Pulse Generator Implant Date: 20211123

## 2021-10-21 ENCOUNTER — Encounter: Payer: Self-pay | Admitting: Hematology and Oncology

## 2021-10-23 NOTE — Progress Notes (Signed)
Carelink Summary Report / Loop Recorder 

## 2021-11-01 ENCOUNTER — Encounter (HOSPITAL_BASED_OUTPATIENT_CLINIC_OR_DEPARTMENT_OTHER): Payer: Self-pay

## 2021-11-01 DIAGNOSIS — R0683 Snoring: Secondary | ICD-10-CM

## 2021-11-01 DIAGNOSIS — G4733 Obstructive sleep apnea (adult) (pediatric): Secondary | ICD-10-CM

## 2021-11-02 NOTE — Addendum Note (Signed)
Addended by: Douglass Rivers D on: 11/02/2021 03:29 PM   Modules accepted: Level of Service

## 2021-11-14 ENCOUNTER — Other Ambulatory Visit: Payer: Self-pay

## 2021-11-14 ENCOUNTER — Ambulatory Visit (HOSPITAL_BASED_OUTPATIENT_CLINIC_OR_DEPARTMENT_OTHER): Payer: BC Managed Care – PPO | Attending: Internal Medicine | Admitting: Internal Medicine

## 2021-11-14 VITALS — Ht 72.0 in | Wt 230.0 lb

## 2021-11-14 DIAGNOSIS — G4733 Obstructive sleep apnea (adult) (pediatric): Secondary | ICD-10-CM | POA: Insufficient documentation

## 2021-11-14 DIAGNOSIS — R0683 Snoring: Secondary | ICD-10-CM

## 2021-11-18 DIAGNOSIS — G4733 Obstructive sleep apnea (adult) (pediatric): Secondary | ICD-10-CM | POA: Diagnosis not present

## 2021-11-18 NOTE — Procedures (Signed)
Patient Name: Gabriel Lin, Gabriel Lin Date: 11/14/2021 Gender: Male D.O.B: [birth year] Age (years): 75 Referring Provider: Galen Manila MD Height (inches): 72 Interpreting Physician: Baird Lyons MD, ABSM Weight (lbs): 230 RPSGT: Carolin Coy BMI: 31 MRN: [id number] Neck Size: 16.50  CLINICAL INFORMATION Sleep Study Type: Split Night CPAP Indication for sleep study: Fatigue, Hypertension, Obesity, Snoring Epworth Sleepiness Score: 6  SLEEP STUDY TECHNIQUE As per the AASM Manual for the Scoring of Sleep and Associated Events v2.3 (April 2016) with a hypopnea requiring 4% desaturations.  The channels recorded and monitored were frontal, central and occipital EEG, electrooculogram (EOG), submentalis EMG (chin), nasal and oral airflow, thoracic and abdominal wall motion, anterior tibialis EMG, snore microphone, electrocardiogram, and pulse oximetry. Continuous positive airway pressure (CPAP) was initiated when the patient met split night criteria and was titrated according to treat sleep-disordered breathing.  MEDICATIONS Medications self-administered by patient taken the night of the study : none reported  RESPIRATORY PARAMETERS Diagnostic  Total AHI (/hr): 23.4 RDI (/hr): 79.5 OA Index (/hr): - CA Index (/hr): 0.0 REM AHI (/hr): N/A NREM AHI (/hr): 23.4 Supine AHI (/hr): 37.4 Non-supine AHI (/hr): 12 Min O2 Sat (%): 89.0 Mean O2 (%): 93.1 Time below 88% (min): 0   Titration  Optimal Pressure (cm): 18 AHI at Optimal Pressure (/hr): 0 Min O2 at Optimal Pressure (%): 95.0 Supine % at Optimal (%): 0 Sleep % at Optimal (%): 96   SLEEP ARCHITECTURE The recording time for the entire night was 427.2 minutes.  During a baseline period of 235.6 minutes, the patient slept for 154.0 minutes in REM and nonREM, yielding a sleep efficiency of 65.4%%. Sleep onset after lights out was 24.1 minutes with a REM latency of N/A minutes. The patient spent 40.3%% of the night in stage N1  sleep, 59.7%% in stage N2 sleep, 0.0%% in stage N3 and 0% in REM.  During the titration period of 182.8 minutes, the patient slept for 78.0 minutes in REM and nonREM, yielding a sleep efficiency of 42.7%%. Sleep onset after CPAP initiation was 58.2 minutes with a REM latency of 107.5 minutes. The patient spent 42.9%% of the night in stage N1 sleep, 35.3%% in stage N2 sleep, 0.0%% in stage N3 and 21.8% in REM.  CARDIAC DATA The 2 lead EKG demonstrated sinus rhythm. The mean heart rate was 65.2 beats per minute. Other EKG findings include: PACs.  LEG MOVEMENT DATA The total Periodic Limb Movements of Sleep (PLMS) were 0. The PLMS index was 0.0 .  IMPRESSIONS - Moderate obstructive sleep apnea occurred during the diagnostic portion of the study (AHI = 23.4/hour). An optimal PAP pressure was selected for this patient ( 18 cm of water) - The patient had minimal or no oxygen desaturation during the diagnostic portion of the study (Min O2 = 89.0%). Minimum O2 saturation on CPAP 18 was 95%. - The patient snored with soft snoring volume during the diagnostic portion of the study. - PACs were noted during this study. - Clinically significant periodic limb movements did not occur during sleep.  DIAGNOSIS - Obstructive Sleep Apnea (G47.33)  RECOMMENDATIONS - Trial of CPAP therapy on 18 cm H2O or autopap 10-20. - Patient used a Medium size Resmed Full Face Mask AirFit F30 mask and heated humidification. - Be careful with alcohol, sedatives and other CNS depressants that may worsen sleep apnea and disrupt normal sleep architecture. - Sleep hygiene should be reviewed to assess factors that may improve sleep quality. - Weight management and regular  exercise should be initiated or continued.  [Electronically signed] 11/18/2021 12:41 PM  Baird Lyons MD, [place], American Board of Sleep Medicine   NPI: [id number]                         [place],  [facility]  ELECTRONICALLY SIGNED ON:  11/18/2021, 12:37 PM [place] PH: [phone]   FX: [phone] [place]

## 2021-11-26 ENCOUNTER — Ambulatory Visit (INDEPENDENT_AMBULATORY_CARE_PROVIDER_SITE_OTHER): Payer: BC Managed Care – PPO

## 2021-11-26 DIAGNOSIS — G459 Transient cerebral ischemic attack, unspecified: Secondary | ICD-10-CM

## 2021-11-26 LAB — CUP PACEART REMOTE DEVICE CHECK
Date Time Interrogation Session: 20230205231451
Implantable Pulse Generator Implant Date: 20211123

## 2021-11-28 NOTE — Progress Notes (Signed)
Carelink Summary Report / Loop Recorder 

## 2021-12-31 ENCOUNTER — Ambulatory Visit (INDEPENDENT_AMBULATORY_CARE_PROVIDER_SITE_OTHER): Payer: BC Managed Care – PPO

## 2021-12-31 DIAGNOSIS — G459 Transient cerebral ischemic attack, unspecified: Secondary | ICD-10-CM | POA: Diagnosis not present

## 2021-12-31 LAB — CUP PACEART REMOTE DEVICE CHECK
Date Time Interrogation Session: 20230312230327
Implantable Pulse Generator Implant Date: 20211123

## 2022-01-10 NOTE — Progress Notes (Signed)
Carelink Summary Report / Loop Recorder 

## 2022-02-04 ENCOUNTER — Ambulatory Visit (INDEPENDENT_AMBULATORY_CARE_PROVIDER_SITE_OTHER): Payer: BC Managed Care – PPO

## 2022-02-04 DIAGNOSIS — G459 Transient cerebral ischemic attack, unspecified: Secondary | ICD-10-CM

## 2022-02-04 LAB — CUP PACEART REMOTE DEVICE CHECK
Date Time Interrogation Session: 20230414230606
Implantable Pulse Generator Implant Date: 20211123

## 2022-02-20 NOTE — Progress Notes (Signed)
Carelink Summary Report / Loop Recorder 

## 2022-02-25 ENCOUNTER — Emergency Department (HOSPITAL_BASED_OUTPATIENT_CLINIC_OR_DEPARTMENT_OTHER): Payer: BC Managed Care – PPO

## 2022-02-25 ENCOUNTER — Other Ambulatory Visit: Payer: Self-pay

## 2022-02-25 ENCOUNTER — Emergency Department (HOSPITAL_BASED_OUTPATIENT_CLINIC_OR_DEPARTMENT_OTHER)
Admission: EM | Admit: 2022-02-25 | Discharge: 2022-02-25 | Disposition: A | Discharge status: Home / Self Care | Payer: BC Managed Care – PPO | Attending: Emergency Medicine | Admitting: Emergency Medicine

## 2022-02-25 DIAGNOSIS — K4090 Unilateral inguinal hernia, without obstruction or gangrene, not specified as recurrent: Secondary | ICD-10-CM | POA: Insufficient documentation

## 2022-02-25 DIAGNOSIS — Z7982 Long term (current) use of aspirin: Secondary | ICD-10-CM | POA: Diagnosis not present

## 2022-02-25 DIAGNOSIS — R110 Nausea: Secondary | ICD-10-CM | POA: Insufficient documentation

## 2022-02-25 DIAGNOSIS — R109 Unspecified abdominal pain: Secondary | ICD-10-CM | POA: Diagnosis not present

## 2022-02-25 DIAGNOSIS — K760 Fatty (change of) liver, not elsewhere classified: Secondary | ICD-10-CM | POA: Diagnosis not present

## 2022-02-25 DIAGNOSIS — N189 Chronic kidney disease, unspecified: Secondary | ICD-10-CM | POA: Diagnosis not present

## 2022-02-25 DIAGNOSIS — N2889 Other specified disorders of kidney and ureter: Secondary | ICD-10-CM | POA: Diagnosis not present

## 2022-02-25 DIAGNOSIS — K5730 Diverticulosis of large intestine without perforation or abscess without bleeding: Secondary | ICD-10-CM | POA: Diagnosis not present

## 2022-02-25 DIAGNOSIS — R197 Diarrhea, unspecified: Secondary | ICD-10-CM | POA: Diagnosis not present

## 2022-02-25 DIAGNOSIS — R718 Other abnormality of red blood cells: Secondary | ICD-10-CM | POA: Insufficient documentation

## 2022-02-25 DIAGNOSIS — R1031 Right lower quadrant pain: Secondary | ICD-10-CM | POA: Diagnosis not present

## 2022-02-25 LAB — URINALYSIS, ROUTINE W REFLEX MICROSCOPIC
Bilirubin Urine: NEGATIVE
Glucose, UA: NEGATIVE mg/dL
Ketones, ur: NEGATIVE mg/dL
Leukocytes,Ua: NEGATIVE
Nitrite: NEGATIVE
Protein, ur: NEGATIVE mg/dL
Specific Gravity, Urine: 1.01 (ref 1.005–1.030)
pH: 5.5 (ref 5.0–8.0)

## 2022-02-25 LAB — COMPREHENSIVE METABOLIC PANEL
ALT: 32 U/L (ref 0–44)
AST: 21 U/L (ref 15–41)
Albumin: 4.5 g/dL (ref 3.5–5.0)
Alkaline Phosphatase: 68 U/L (ref 38–126)
Anion gap: 7 (ref 5–15)
BUN: 18 mg/dL (ref 8–23)
CO2: 29 mmol/L (ref 22–32)
Calcium: 9.9 mg/dL (ref 8.9–10.3)
Chloride: 102 mmol/L (ref 98–111)
Creatinine, Ser: 1.44 mg/dL — ABNORMAL HIGH (ref 0.61–1.24)
GFR, Estimated: 55 mL/min — ABNORMAL LOW (ref >60)
Glucose, Bld: 97 mg/dL (ref 70–99)
Potassium: 4.8 mmol/L (ref 3.5–5.1)
Sodium: 138 mmol/L (ref 135–145)
Total Bilirubin: 0.7 mg/dL (ref 0.3–1.2)
Total Protein: 7.1 g/dL (ref 6.5–8.1)

## 2022-02-25 LAB — CBC WITH DIFFERENTIAL/PLATELET
Abs Immature Granulocytes: 0.08 10*3/uL — ABNORMAL HIGH (ref 0.00–0.07)
Basophils Absolute: 0.1 10*3/uL (ref 0.0–0.1)
Basophils Relative: 1 %
Eosinophils Absolute: 0.1 10*3/uL (ref 0.0–0.5)
Eosinophils Relative: 1 %
HCT: 52 % (ref 39.0–52.0)
Hemoglobin: 17.7 g/dL — ABNORMAL HIGH (ref 13.0–17.0)
Immature Granulocytes: 1 %
Lymphocytes Relative: 20 %
Lymphs Abs: 1.3 10*3/uL (ref 0.7–4.0)
MCH: 30.9 pg (ref 26.0–34.0)
MCHC: 34 g/dL (ref 30.0–36.0)
MCV: 90.8 fL (ref 80.0–100.0)
Monocytes Absolute: 0.6 10*3/uL (ref 0.1–1.0)
Monocytes Relative: 10 %
Neutro Abs: 4.2 10*3/uL (ref 1.7–7.7)
Neutrophils Relative %: 67 %
Platelets: 155 10*3/uL (ref 150–400)
RBC: 5.73 MIL/uL (ref 4.22–5.81)
RDW: 12.8 % (ref 11.5–15.5)
WBC: 6.2 10*3/uL (ref 4.0–10.5)
nRBC: 0 % (ref 0.0–0.2)

## 2022-02-25 LAB — URINALYSIS, MICROSCOPIC (REFLEX)

## 2022-02-25 LAB — LIPASE, BLOOD: Lipase: 35 U/L (ref 11–51)

## 2022-02-25 MED ORDER — SODIUM CHLORIDE 0.9 % IV BOLUS
1000.0000 mL | Freq: Once | INTRAVENOUS | Status: AC
  Administered 2022-02-25: 1000 mL via INTRAVENOUS

## 2022-02-25 MED ORDER — DICYCLOMINE HCL 20 MG PO TABS: 20.0000 mg | ORAL_TABLET | Freq: Two times a day (BID) | ORAL | 0 refills | Status: AC

## 2022-02-25 MED ORDER — IOHEXOL 300 MG/ML  SOLN
100.0000 mL | Freq: Once | INTRAMUSCULAR | Status: AC | PRN
  Administered 2022-02-25: 100 mL via INTRAVENOUS

## 2022-02-25 NOTE — Discharge Instructions (Signed)
Please read and follow all provided instructions. ? ?Your diagnoses today include:  ?1. Right sided abdominal pain   ? ? ?Tests performed today include: ?Blood cell counts and platelets ?Kidney and liver function tests: creatinine was 1.4 ?Pancreas function test (called lipase) ?Urine test to look for infection ?CT scan: Showed an incidental left renal cyst and left inguinal hernia that does not contain bowel, otherwise normal appendix or other acute findings ?Vital signs. See below for your results today.  ? ?Medications prescribed:  ?Bentyl - medication for intestinal cramps and spasms ? ?Take any prescribed medications only as directed. ? ?Home care instructions:  ?Follow any educational materials contained in this packet. ?Keep yourself hydrated to the point where your urine is light yellow to clear. ? ?Follow-up instructions: ?Please follow-up with your primary care provider in the next 3 days for further evaluation of your symptoms.   ? ?Return instructions:  ?SEEK IMMEDIATE MEDICAL ATTENTION IF: ?The pain does not go away or becomes severe  ?A temperature above 101F develops  ?Repeated vomiting occurs (multiple episodes)  ?The pain becomes localized to portions of the abdomen. The right side could possibly be appendicitis. In an adult, the left lower portion of the abdomen could be colitis or diverticulitis.  ?Blood is being passed in stools or vomit (bright red or black tarry stools)  ?You develop chest pain, difficulty breathing, dizziness or fainting, or become confused, poorly responsive, or inconsolable (young children) ?If you have any other emergent concerns regarding your health ? ?Additional Information: ?Abdominal (belly) pain can be caused by many things. Your caregiver performed an examination and possibly ordered blood/urine tests and imaging (CT scan, x-rays, ultrasound). Many cases can be observed and treated at home after initial evaluation in the emergency department. Even though you are  being discharged home, abdominal pain can be unpredictable. Therefore, you need a repeated exam if your pain does not resolve, returns, or worsens. Most patients with abdominal pain don't have to be admitted to the hospital or have surgery, but serious problems like appendicitis and gallbladder attacks can start out as nonspecific pain. Many abdominal conditions cannot be diagnosed in one visit, so follow-up evaluations are very important. ? ?Your vital signs today were: ?BP 137/89 (BP Location: Left Arm)   Pulse 66   Temp 97.7 ?F (36.5 ?C) (Oral)   Resp 18   Ht 6' (1.829 m)   Wt 107 kg   SpO2 100%   BMI 32.01 kg/m?  ?If your blood pressure (bp) was elevated above 135/85 this visit, please have this repeated by your doctor within one month. ?-------------- ? ?

## 2022-02-25 NOTE — ED Provider Notes (Signed)
?[facility] ?Provider Note ? ? ?CSN: [id number] ?Arrival date & time: 02/25/22  0932 ? ?  ? ?History ? ?Chief Complaint  ?Patient presents with  ? Abdominal Pain  ? ? ?Gabriel Lin is a 61 y.o. male. ? ?Patient with no past surgical history, history of TIA, history chronic kidney disease --presents to the emergency department today for evaluation of right-sided abdominal pain.  Patient's symptoms started about a week ago with vague nausea without abdominal pain.  He is on a camping trip but was drinking bottled water and initially thought his symptoms were due to poor diet.  Two days ago he developed right-sided abdominal pain which has been waxing and waning, but not resolved.  He has been having more frequent bowel movements that are loose but not watery.  No blood in the stool.  He has had nausea without episodes of vomiting.  No chest pain or shortness of breath.  Reports 1 instance of a subjective fever, however no documented fevers.  No treatments for these symptoms.  No urinary symptoms.  No known sick contacts. ? ? ?  ? ?Home Medications ?Prior to Admission medications   ?Medication Sig Start Date End Date Taking? Authorizing Provider  ?ALPRAZolam (XANAX) 0.25 MG tablet Take by mouth. Taking 1/2 tablet PO as needed 09/29/20   [provider]  ?aspirin 81 MG chewable tablet Chew by mouth daily.    [provider]  ?Aspirin-Acetaminophen-Caffeine (GOODY HEADACHE PO) Take by mouth. As needed    [provider]  ?atenolol (TENORMIN) 50 MG tablet Take 50 mg by mouth daily.  10/13/19   [provider]  ?co-enzyme Q-10 30 MG capsule Take by mouth. 11/23/20   [provider]  ?ergocalciferol (VITAMIN D2) 1.25 MG (50000 UT) capsule Take 50,000 Units by mouth every 7 (seven) days.  07/21/19   [provider]  ?esomeprazole (NEXIUM) 20 MG capsule Take 20 mg by mouth daily at 12 noon.    [provider]  ?rosuvastatin (CRESTOR) 5  MG tablet Take 5 mg by mouth daily.    [provider]  ?   ? ?Allergies    ?Patient has no known allergies.   ? ?Review of Systems   ?Review of Systems ? ?Physical Exam ?Updated Vital Signs ?BP (!) 143/95 (BP Location: Right Arm)   Pulse 71   Temp 97.7 ?F (36.5 ?C) (Oral)   Resp 18   Ht 6' (1.829 m)   Wt 107 kg   SpO2 98%   BMI 32.01 kg/m?  ?Physical Exam ?Vitals and nursing note reviewed.  ?Constitutional:   ?   General: He is not in acute distress. ?   Appearance: He is well-developed.  ?HENT:  ?   Head: Normocephalic and atraumatic.  ?Eyes:  ?   General:     ?   Right eye: No discharge.     ?   Left eye: No discharge.  ?   Conjunctiva/sclera: Conjunctivae normal.  ?Cardiovascular:  ?   Rate and Rhythm: Normal rate and regular rhythm.  ?   Heart sounds: Normal heart sounds.  ?Pulmonary:  ?   Effort: Pulmonary effort is normal.  ?   Breath sounds: Normal breath sounds.  ?Abdominal:  ?   Palpations: Abdomen is soft.  ?   Tenderness: There is abdominal tenderness in the right upper quadrant and right lower quadrant. There is no guarding or rebound. Negative signs include Murphy's sign and McBurney's sign.  ?  Comments: Mild to moderate right-sided abdominal pain to palpation, worse in the right lower quadrant and right lateral abdomen  ?Musculoskeletal:  ?   Cervical back: Normal range of motion and neck supple.  ?Skin: ?   General: Skin is warm and dry.  ?Neurological:  ?   Mental Status: He is alert.  ? ? ?ED Results / Procedures / Treatments   ?Labs ?(all labs ordered are listed, but only abnormal results are displayed) ?Labs Reviewed  ?CBC WITH DIFFERENTIAL/PLATELET - Abnormal; Notable for the following components:  ?    Result Value  ? Hemoglobin 17.7 (*)   ? Abs Immature Granulocytes 0.08 (*)   ? All other components within normal limits  ?COMPREHENSIVE METABOLIC PANEL - Abnormal; Notable for the following components:  ? Creatinine, Ser 1.44 (*)   ? GFR, Estimated 55 (*)   ? All other  components within normal limits  ?URINALYSIS, ROUTINE W REFLEX MICROSCOPIC - Abnormal; Notable for the following components:  ? Hgb urine dipstick MODERATE (*)   ? All other components within normal limits  ?URINALYSIS, MICROSCOPIC (REFLEX) - Abnormal; Notable for the following components:  ? Bacteria, UA RARE (*)   ? All other components within normal limits  ?LIPASE, BLOOD  ? ? ?EKG ?None ? ?Radiology ?CT ABDOMEN PELVIS W CONTRAST ? ?Result Date: 02/25/2022 ?CLINICAL DATA:  Pain right lower quadrant EXAM: CT ABDOMEN AND PELVIS WITH CONTRAST TECHNIQUE: Multidetector CT imaging of the abdomen and pelvis was performed using the standard protocol following bolus administration of intravenous contrast. RADIATION DOSE REDUCTION: This exam was performed according to the departmental dose-optimization program which includes automated exposure control, adjustment of the mA and/or kV according to patient size and/or use of iterative reconstruction technique. CONTRAST:  159m OMNIPAQUE IOHEXOL 300 MG/ML  SOLN COMPARISON:  None Available. FINDINGS: Lower chest: Left hemidiaphragm is elevated. Visualized lower lung fields are clear. Hepatobiliary: There is fatty infiltration in the liver. No focal abnormality is seen. There is no dilation of bile ducts. Gallbladder is unremarkable. There is no fluid around the gallbladder. Pancreas: No focal abnormality is seen. Spleen: Spleen measures 13.9 cm in maximum diameter. Adrenals/Urinary Tract: Adrenals are unremarkable. There is no hydronephrosis. Are no renal or ureteral stones. There is 2.3 cm smooth marginated fluid density lesion in the upper pole of left kidney suggesting renal cyst. There is possible subcentimeter cyst in the lower pole of right kidney. Urinary bladder is not distended. There is mild diffuse wall thickening in the bladder. This may be due to incomplete distention or suggest chronic cystitis or chronic outlet obstruction. Stomach/Bowel: Stomach is unremarkable.  Small bowel loops are not dilated. Appendix is not dilated. There is no significant wall thickening in colon. Scattered diverticula are seen in the colon. There is no pericolic stranding. Vascular/Lymphatic: Scattered arterial calcifications and atherosclerotic plaques are seen in the aorta and its major branches. Reproductive: Prostate is enlarged. Other: There is no ascites or pneumoperitoneum. Small umbilical hernia containing fat is seen. Left inguinal hernia containing fat is seen. Musculoskeletal: Degenerative changes are noted at L5-S1 level with encroachment of neural foramina by bony spurs. IMPRESSION: There is no evidence of intestinal obstruction or pneumoperitoneum. Appendix is not dilated. There is no hydronephrosis. Fatty liver. Diverticulosis of colon without focal acute diverticulitis. Enlarged spleen. Other findings as described in the body of the report. Electronically Signed   By: PElmer PickerM.D.   On: 02/25/2022 12:54   ? ?Procedures ?Procedures  ? ? ?Medications Ordered in ED ?  Medications  ?sodium chloride 0.9 % bolus 1,000 mL (1,000 mLs Intravenous New Bag/Given 02/25/22 1016)  ?iohexol (OMNIPAQUE) 300 MG/ML solution 100 mL (100 mLs Intravenous Contrast Given 02/25/22 1242)  ? ? ?ED Course/ Medical Decision Making/ A&P ?  ? ?Patient seen and examined. History obtained directly from patient and wife at bedside.  Reviewed patient's previous labs to determine baseline kidney function. ? ?Labs/EKG: Ordered CBC, CMP, lipase, UA. ? ?Imaging: Will need CT of the abdomen and pelvis, awaiting creatinine to determine if IV contrast can be used. ? ?Medications/Fluids: IV fluid bolus, considered for parenteral pain medications however patient is currently comfortable. ? ?Most recent vital signs reviewed and are as follows: ?BP (!) 143/95 (BP Location: Right Arm)   Pulse 71   Temp 97.7 ?F (36.5 ?C) (Oral)   Resp 18   Ht 6' (1.829 m)   Wt 107 kg   SpO2 98%   BMI 32.01 kg/m?  ? ?Initial  impression: Right-sided abdominal pain, will need to rule out appendicitis or gallbladder disease. ? ?1:35 PM Reassessment performed. Patient appears comfortable. ? ?Labs personally reviewed and interpreted including: CT C with normal

## 2022-02-25 NOTE — ED Triage Notes (Signed)
Pt c/o right sided abdominal pain x 2 days after eating new variety of food he normally doesn't eat. Endorses some nausea, frequent bowel movements but denies diarrhea, no vomiting/no SOB/no chest pain.  ?

## 2022-02-26 ENCOUNTER — Telehealth: Payer: Self-pay | Admitting: Hematology and Oncology

## 2022-02-26 NOTE — Telephone Encounter (Signed)
Attempted to contact patient to reschedule appt on 5/15 per in basket message, no answer so voicemail was left for patient to call [facility]  ?

## 2022-02-27 ENCOUNTER — Telehealth: Payer: Self-pay | Admitting: Hematology and Oncology

## 2022-02-27 NOTE — Telephone Encounter (Signed)
.  Called patient to schedule appointment per 5/10 inbasket, patient is aware of date and time.   ?

## 2022-03-04 ENCOUNTER — Inpatient Hospital Stay: Payer: BC Managed Care – PPO

## 2022-03-04 ENCOUNTER — Inpatient Hospital Stay: Payer: BC Managed Care – PPO | Admitting: Hematology and Oncology

## 2022-03-08 LAB — CUP PACEART REMOTE DEVICE CHECK
Date Time Interrogation Session: 20230517231007
Implantable Pulse Generator Implant Date: 20211123

## 2022-03-11 ENCOUNTER — Inpatient Hospital Stay: Payer: BC Managed Care – PPO | Admitting: Hematology and Oncology

## 2022-03-11 ENCOUNTER — Ambulatory Visit (INDEPENDENT_AMBULATORY_CARE_PROVIDER_SITE_OTHER): Payer: BC Managed Care – PPO

## 2022-03-11 ENCOUNTER — Inpatient Hospital Stay: Payer: BC Managed Care – PPO

## 2022-03-11 DIAGNOSIS — G459 Transient cerebral ischemic attack, unspecified: Secondary | ICD-10-CM | POA: Diagnosis not present

## 2022-03-27 NOTE — Progress Notes (Signed)
Carelink Summary Report / Loop Recorder 

## 2022-04-15 ENCOUNTER — Ambulatory Visit (INDEPENDENT_AMBULATORY_CARE_PROVIDER_SITE_OTHER): Payer: BC Managed Care – PPO

## 2022-04-15 DIAGNOSIS — G459 Transient cerebral ischemic attack, unspecified: Secondary | ICD-10-CM | POA: Diagnosis not present

## 2022-04-15 LAB — CUP PACEART REMOTE DEVICE CHECK
Date Time Interrogation Session: 20230619230444
Implantable Pulse Generator Implant Date: 20211123

## 2022-05-08 NOTE — Progress Notes (Signed)
Carelink Summary Report / Loop Recorder 

## 2022-05-16 LAB — CUP PACEART REMOTE DEVICE CHECK
Date Time Interrogation Session: 20230722231101
Implantable Pulse Generator Implant Date: 20211123

## 2022-05-20 ENCOUNTER — Ambulatory Visit (INDEPENDENT_AMBULATORY_CARE_PROVIDER_SITE_OTHER): Payer: BC Managed Care – PPO

## 2022-05-20 DIAGNOSIS — G459 Transient cerebral ischemic attack, unspecified: Secondary | ICD-10-CM | POA: Diagnosis not present

## 2022-06-03 DIAGNOSIS — M65331 Trigger finger, right middle finger: Secondary | ICD-10-CM | POA: Diagnosis not present

## 2022-06-20 NOTE — Progress Notes (Signed)
Carelink Summary Report / Loop Recorder 

## 2022-06-21 ENCOUNTER — Ambulatory Visit (INDEPENDENT_AMBULATORY_CARE_PROVIDER_SITE_OTHER): Payer: BC Managed Care – PPO

## 2022-06-21 DIAGNOSIS — G459 Transient cerebral ischemic attack, unspecified: Secondary | ICD-10-CM | POA: Diagnosis not present

## 2022-06-22 LAB — CUP PACEART REMOTE DEVICE CHECK
Date Time Interrogation Session: 20230830231230
Implantable Pulse Generator Implant Date: 20211123

## 2022-07-10 NOTE — Progress Notes (Signed)
Carelink Summary Report / Loop Recorder 

## 2022-07-15 DIAGNOSIS — Z713 Dietary counseling and surveillance: Secondary | ICD-10-CM | POA: Diagnosis not present

## 2022-07-23 LAB — CUP PACEART REMOTE DEVICE CHECK
Date Time Interrogation Session: 20231002231603
Implantable Pulse Generator Implant Date: 20211123

## 2022-07-24 ENCOUNTER — Ambulatory Visit (INDEPENDENT_AMBULATORY_CARE_PROVIDER_SITE_OTHER): Payer: BC Managed Care – PPO

## 2022-07-24 DIAGNOSIS — I639 Cerebral infarction, unspecified: Secondary | ICD-10-CM | POA: Diagnosis not present

## 2022-07-29 NOTE — Progress Notes (Signed)
Carelink Summary Report / Loop Recorder 

## 2022-08-12 IMAGING — CT CT HEAD W/O CM
2 of 3 series · 14 of 47 positions shown, 17 images · non-contrast
Comparison: None.

CLINICAL DATA: Right-sided facial droop with headache and numbness
beginning last night. Symptoms resolving.

EXAM:
CT HEAD WITHOUT CONTRAST
TECHNIQUE: Contiguous axial images were obtained from the base of the skull
through the vertex without intravenous contrast.

[Series 2: head wo · axial · 0.47mm/px · z∈[+1543,+1683]mm · 11 of 34 slices shown, 14 images]
[im 3/34  brain]
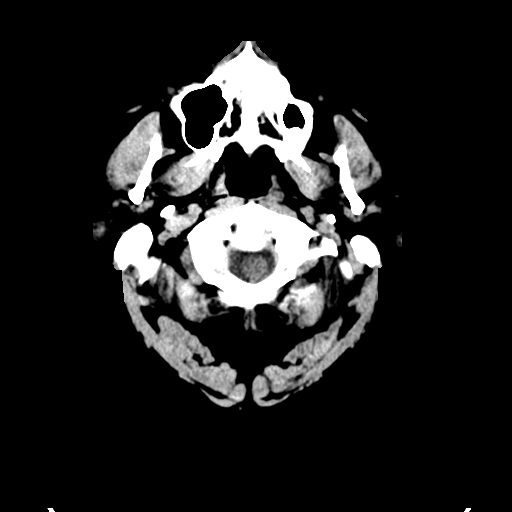
[im 3/34  bone]
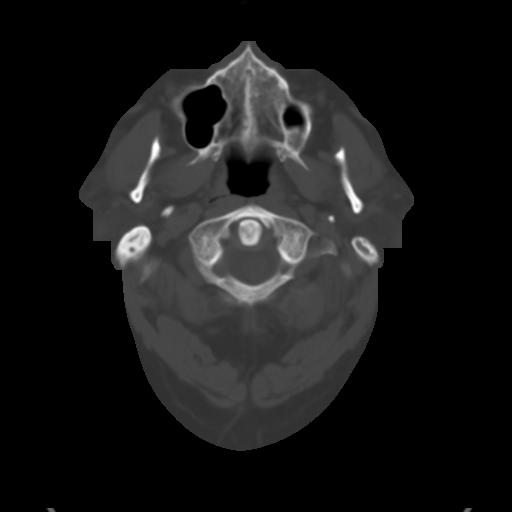
[im 5/34  brain]
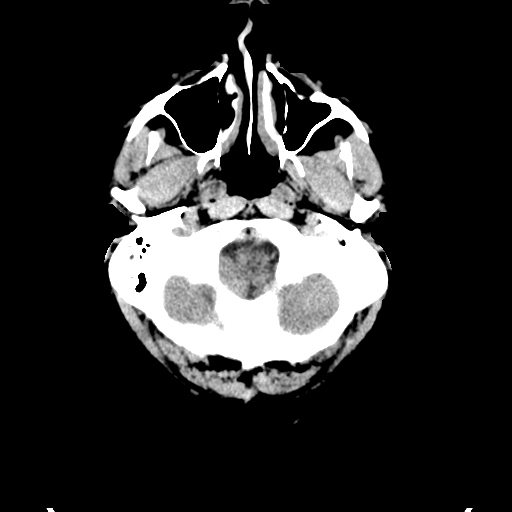
[im 8/34  brain]
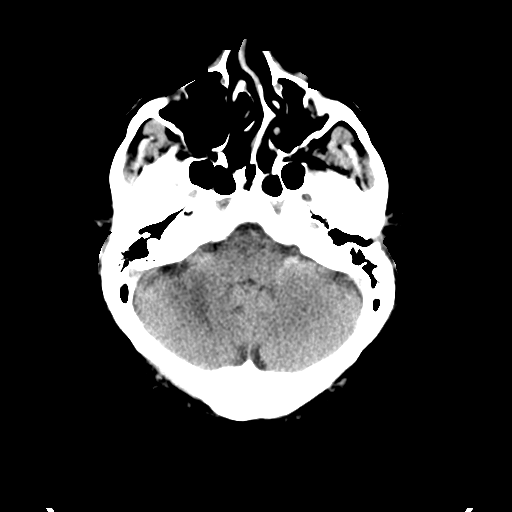
[im 11/34  brain]
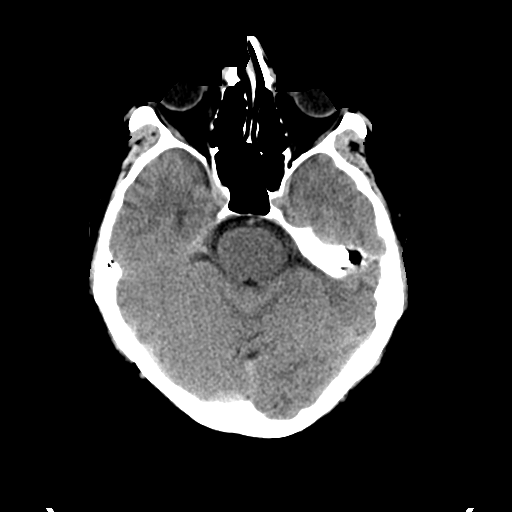
[im 14/34  brain]
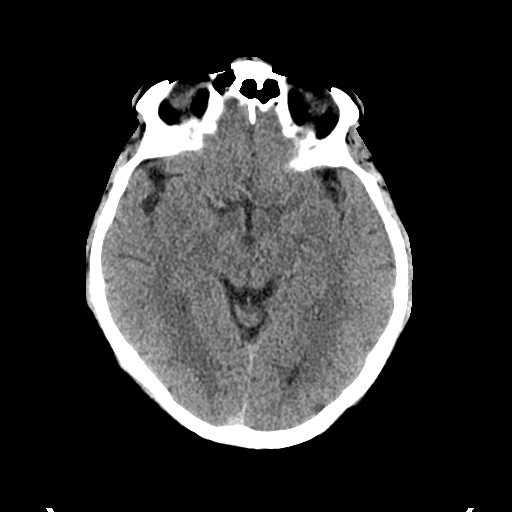
[im 14/34  bone]
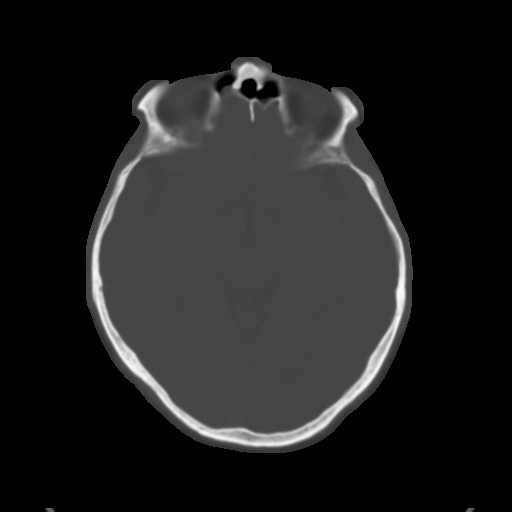
[im 18/34  brain]
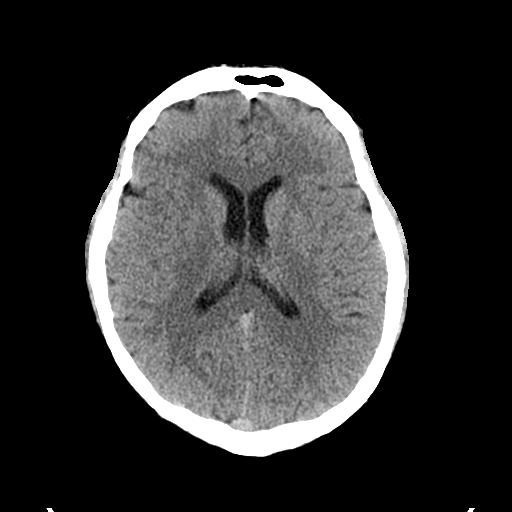
[im 20/34  brain]
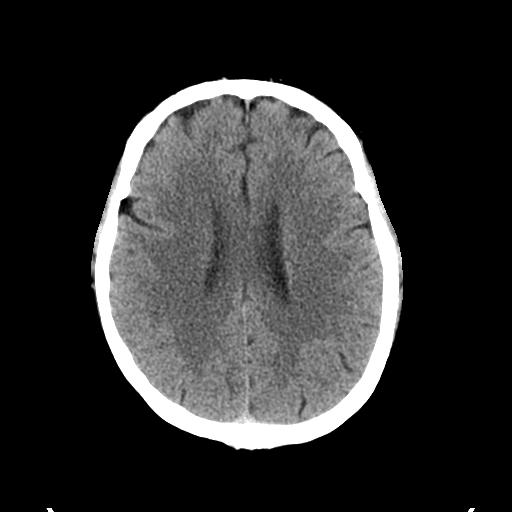
[im 23/34  brain]
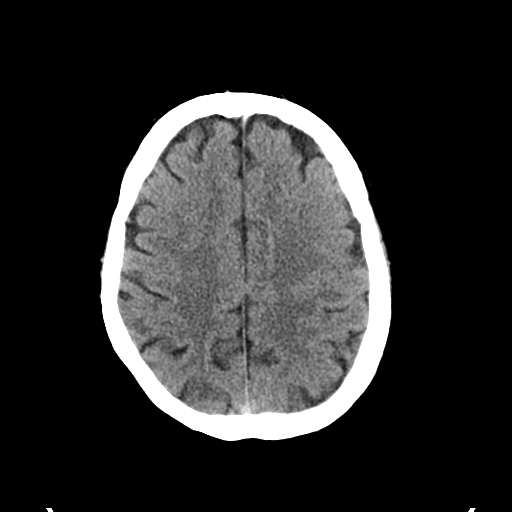
[im 26/34  brain]
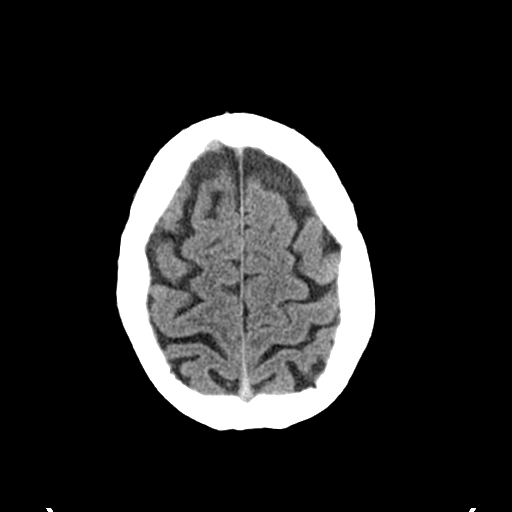
[im 26/34  bone]
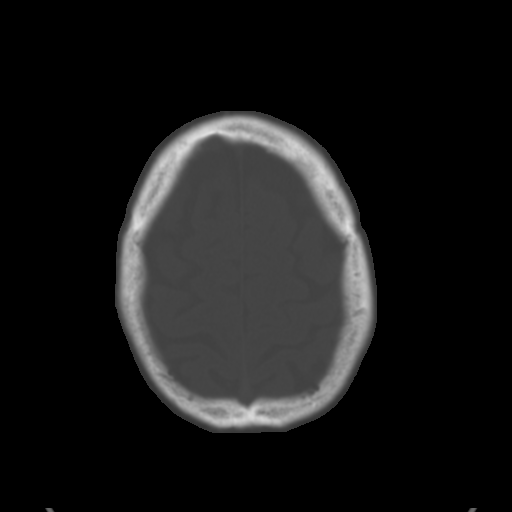
[im 29/34  brain]
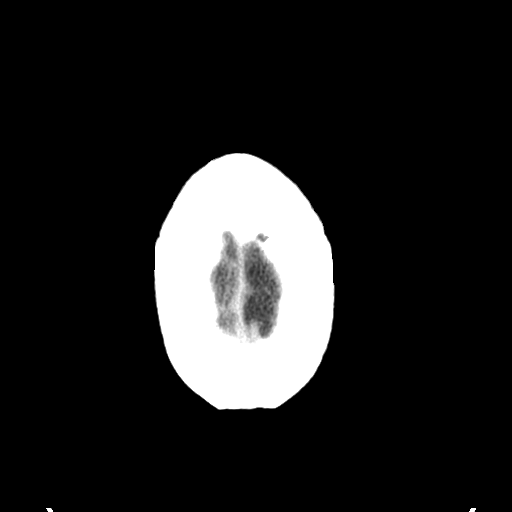
[im 31/34  brain]
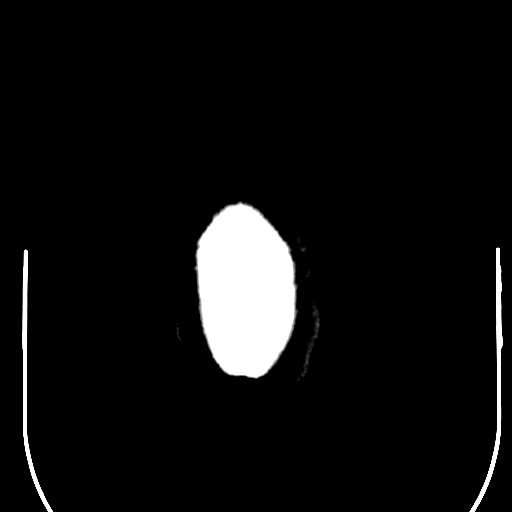

[Series 5: coronal soft tissue · coronal · 0.32mm/px · 3 of 72 slices shown]
[im 24/72  brain]
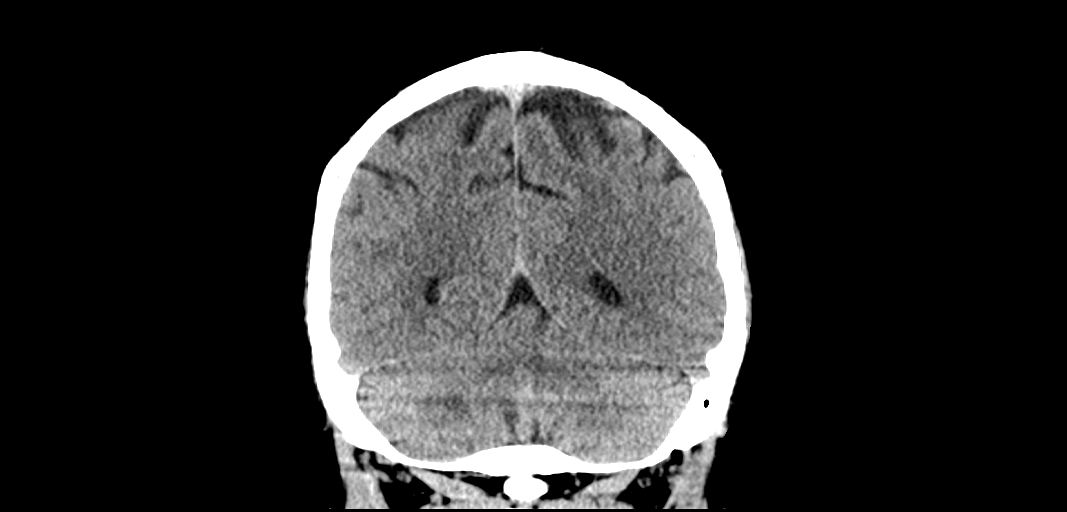
[im 32/72  brain]
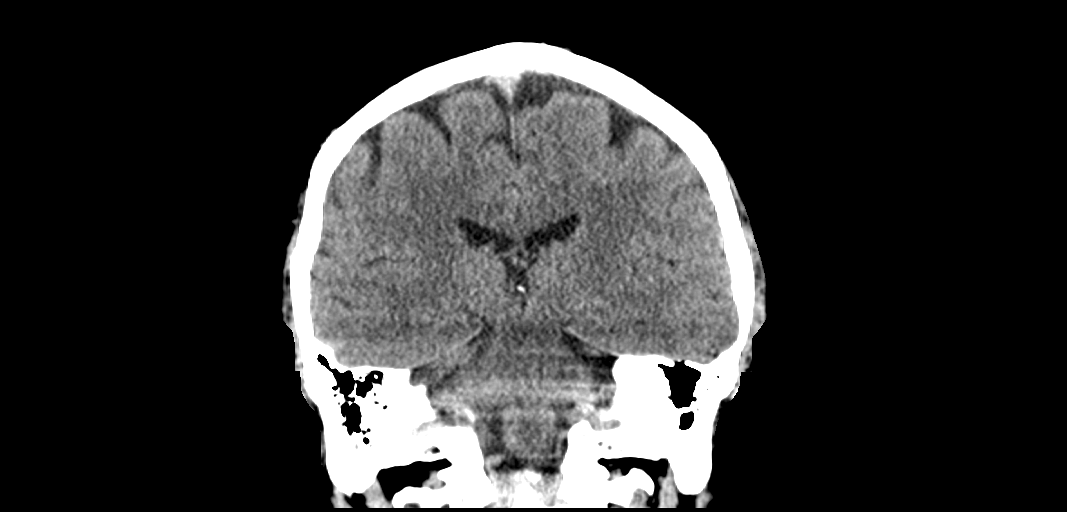
[im 40/72  brain]
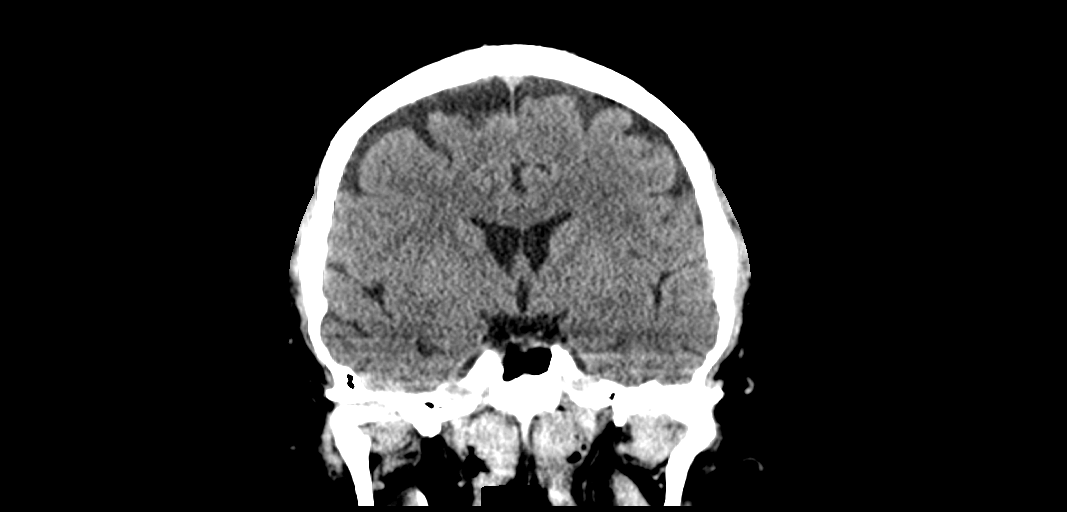

[14 of 47 positions shown; findings below may reference images not displayed]

FINDINGS: Brain: Ventricles, cisterns and other CSF spaces are normal. There
is no mass, mass effect, shift of midline structures or acute
hemorrhage. No evidence of acute infarction.

Vascular: No hyperdense vessel or unexpected calcification.

Skull: Normal. Negative for fracture or focal lesion.

Sinuses/Orbits: No acute finding.

Other: None.
IMPRESSION: No acute findings.

## 2022-08-26 ENCOUNTER — Ambulatory Visit (INDEPENDENT_AMBULATORY_CARE_PROVIDER_SITE_OTHER): Payer: BC Managed Care – PPO

## 2022-08-26 DIAGNOSIS — G459 Transient cerebral ischemic attack, unspecified: Secondary | ICD-10-CM | POA: Diagnosis not present

## 2022-08-26 LAB — CUP PACEART REMOTE DEVICE CHECK
Date Time Interrogation Session: 20231105231428
Implantable Pulse Generator Implant Date: 20211123

## 2022-09-23 NOTE — Progress Notes (Signed)
Carelink Summary Report / Loop Recorder 

## 2022-09-29 LAB — CUP PACEART REMOTE DEVICE CHECK
Date Time Interrogation Session: 20231208230440
Implantable Pulse Generator Implant Date: 20211123

## 2022-09-30 ENCOUNTER — Ambulatory Visit (INDEPENDENT_AMBULATORY_CARE_PROVIDER_SITE_OTHER): Payer: BC Managed Care – PPO

## 2022-09-30 ENCOUNTER — Telehealth: Payer: Self-pay | Admitting: Hematology and Oncology

## 2022-09-30 DIAGNOSIS — G459 Transient cerebral ischemic attack, unspecified: Secondary | ICD-10-CM

## 2022-09-30 NOTE — Telephone Encounter (Signed)
Called patient per 12/11 in basket message. Notified patient of appointment time.

## 2022-10-09 ENCOUNTER — Telehealth: Payer: Self-pay | Admitting: *Deleted

## 2022-10-09 NOTE — Telephone Encounter (Signed)
Received call from pt stating he tested + for Covid on 10/06/22. Advised we need to cancel his appt  on tomorrow. Pt states he will be out of town for the holidays. Advised we can fit him in on 10/22/22. Pt concerned that his HGB is up over 18. Advised to continue to take his ASA 81 mg and use his CPAP. Advised that Dr. Lorenso Courier is ok with appt in January. Pt voiced understanding.

## 2022-10-10 ENCOUNTER — Other Ambulatory Visit: Payer: BC Managed Care – PPO

## 2022-10-10 ENCOUNTER — Ambulatory Visit: Payer: BC Managed Care – PPO | Admitting: Hematology and Oncology

## 2022-10-22 ENCOUNTER — Other Ambulatory Visit: Payer: Self-pay | Admitting: Hematology and Oncology

## 2022-10-22 ENCOUNTER — Inpatient Hospital Stay: Payer: BC Managed Care – PPO | Attending: Hematology and Oncology

## 2022-10-22 ENCOUNTER — Inpatient Hospital Stay (HOSPITAL_BASED_OUTPATIENT_CLINIC_OR_DEPARTMENT_OTHER): Payer: BC Managed Care – PPO | Admitting: Hematology and Oncology

## 2022-10-22 VITALS — BP 147/77 | HR 74 | Temp 97.7°F | Resp 15 | Wt 239.0 lb

## 2022-10-22 DIAGNOSIS — Z87891 Personal history of nicotine dependence: Secondary | ICD-10-CM | POA: Diagnosis not present

## 2022-10-22 DIAGNOSIS — D751 Secondary polycythemia: Secondary | ICD-10-CM | POA: Insufficient documentation

## 2022-10-22 DIAGNOSIS — Z8673 Personal history of transient ischemic attack (TIA), and cerebral infarction without residual deficits: Secondary | ICD-10-CM | POA: Insufficient documentation

## 2022-10-22 DIAGNOSIS — G4733 Obstructive sleep apnea (adult) (pediatric): Secondary | ICD-10-CM

## 2022-10-22 DIAGNOSIS — Z800 Family history of malignant neoplasm of digestive organs: Secondary | ICD-10-CM | POA: Insufficient documentation

## 2022-10-22 DIAGNOSIS — Z7982 Long term (current) use of aspirin: Secondary | ICD-10-CM | POA: Diagnosis not present

## 2022-10-22 LAB — CBC WITH DIFFERENTIAL (CANCER CENTER ONLY)
Abs Immature Granulocytes: 0.15 10*3/uL — ABNORMAL HIGH (ref 0.00–0.07)
Basophils Absolute: 0.1 10*3/uL (ref 0.0–0.1)
Basophils Relative: 1 %
Eosinophils Absolute: 0.1 10*3/uL (ref 0.0–0.5)
Eosinophils Relative: 1 %
HCT: 50.4 % (ref 39.0–52.0)
Hemoglobin: 17.5 g/dL — ABNORMAL HIGH (ref 13.0–17.0)
Immature Granulocytes: 2 %
Lymphocytes Relative: 19 %
Lymphs Abs: 1.4 10*3/uL (ref 0.7–4.0)
MCH: 31.6 pg (ref 26.0–34.0)
MCHC: 34.7 g/dL (ref 30.0–36.0)
MCV: 91 fL (ref 80.0–100.0)
Monocytes Absolute: 0.6 10*3/uL (ref 0.1–1.0)
Monocytes Relative: 8 %
Neutro Abs: 5 10*3/uL (ref 1.7–7.7)
Neutrophils Relative %: 69 %
Platelet Count: 164 10*3/uL (ref 150–400)
RBC: 5.54 MIL/uL (ref 4.22–5.81)
RDW: 12.6 % (ref 11.5–15.5)
WBC Count: 7.3 10*3/uL (ref 4.0–10.5)
nRBC: 0 % (ref 0.0–0.2)

## 2022-10-22 LAB — CMP (CANCER CENTER ONLY)
ALT: 28 U/L (ref 0–44)
AST: 18 U/L (ref 15–41)
Albumin: 4.3 g/dL (ref 3.5–5.0)
Alkaline Phosphatase: 64 U/L (ref 38–126)
Anion gap: 5 (ref 5–15)
BUN: 28 mg/dL — ABNORMAL HIGH (ref 8–23)
CO2: 27 mmol/L (ref 22–32)
Calcium: 9.5 mg/dL (ref 8.9–10.3)
Chloride: 104 mmol/L (ref 98–111)
Creatinine: 1.41 mg/dL — ABNORMAL HIGH (ref 0.61–1.24)
GFR, Estimated: 56 mL/min — ABNORMAL LOW (ref >60)
Glucose, Bld: 88 mg/dL (ref 70–99)
Potassium: 4.7 mmol/L (ref 3.5–5.1)
Sodium: 136 mmol/L (ref 135–145)
Total Bilirubin: 0.5 mg/dL (ref 0.3–1.2)
Total Protein: 7.2 g/dL (ref 6.5–8.1)

## 2022-10-22 NOTE — Progress Notes (Signed)
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 Lorenso Courier. MPN panel shows Tier II mutation, but no canonical mutations.    Interval History:  Gabriel Lin 62 y.o. male with medical history significant for secondary polycythemia who presents for a follow up visit. The patient's last visit was on 09/03/2021 at which time he established care. In the interim since the last visit he stopped vaping 3 weeks ago and has been adjusting his CPAP machine.  On exam today Gabriel Lin is accompanied by his wife.  He reports he has been good in the interim since her last visit.  He notes that after the sleep study he noticed a "huge difference".  He reports that he has been auto titrating his sleep apnea machine and reports he needs it at a level 18 but is only been a level 10.  He reports a little 15 "blew it off my face".  He notes he quit vaping approximately 1 month ago.  He also has some fullness in his side on the left where his spleen is enlarged.  He notes that previously he was doing "a lot of vaping".  He notes that he is nervous about activity and the threat of a blood clot.  He is taking his aspirin therapy daily and is not having any issues with bleeding, bruising, or dark stools.  He denies any fevers, chills, sweats.  A full 10 point ROS was otherwise negative.  MEDICAL HISTORY:  Past Medical History:  Diagnosis Date   Cataract    "early"   Chronic kidney disease    stage III- stable   COVID    GERD (gastroesophageal reflux disease)    Hyperlipidemia    no meds taken   Hypertension    PFO (patent foramen ovale)    Sleep apnea    wears CPA   Stroke ([place]) 09/12/2020   TIA (transient  ischemic attack)     SURGICAL HISTORY: Past Surgical History:  Procedure Laterality Date   BUBBLE STUDY  09/12/2020   Procedure: BUBBLE STUDY;  Surgeon: Buford Dresser, MD;  Location: [facility];  Service: Cardiovascular;;   COLONOSCOPY     HERNIA REPAIR     double hernia repair- 2005 ?   LAMINECTOMY     LOOP RECORDER INSERTION N/A 09/12/2020   Procedure: LOOP RECORDER INSERTION;  Surgeon: [name], MD;  Location: [facility];  Service: Cardiovascular;  Laterality: N/A;   POLYPECTOMY     TEE WITHOUT CARDIOVERSION N/A 09/12/2020   Procedure: TRANSESOPHAGEAL ECHOCARDIOGRAM (TEE);  Surgeon: Buford Dresser, MD;  Location: [facility] END[facility];  Service: Cardiovascular;  Laterality: N/A;    SOCIAL HISTORY: Social History   Socioeconomic History   Marital status: Married    Spouse name: Darby   Number of children: Not on file   Years of education: Not on file   Highest education level: Not on file  Occupational History   Not on file  Tobacco Use   Smoking status: Former   Smokeless tobacco: Never   Tobacco comments:    2012 quit  Vaping Use   Vaping Use: Some days  Substance and Sexual Activity   Alcohol use: Not Currently   Drug use: Never  Sexual activity: Not on file  Other Topics Concern   Not on file  Social History Narrative   Lives with wife   Right handed   Drinks 1-2 cups daily caffeine   Social Determinants of Health   Financial Resource Strain: Not on file  Food Insecurity: Not on file  Transportation Needs: Not on file  Physical Activity: Not on file  Stress: Not on file  Social Connections: Not on file  Intimate Partner Violence: Not on file    FAMILY HISTORY: Family History  Problem Relation Age of Onset   Colon cancer Brother 28   Colon polyps Brother    Esophageal cancer Neg Hx    Rectal cancer Neg Hx    Stomach cancer Neg Hx     ALLERGIES:  has No Known Allergies.  MEDICATIONS:  Current Outpatient Medications   Medication Sig Dispense Refill   ALPRAZolam (XANAX) 0.25 MG tablet Take by mouth. Taking 1/2 tablet PO as needed     aspirin 81 MG chewable tablet Chew by mouth daily.     Aspirin-Acetaminophen-Caffeine (GOODY HEADACHE PO) Take by mouth. As needed     atenolol (TENORMIN) 50 MG tablet Take 50 mg by mouth daily.      co-enzyme Q-10 30 MG capsule Take by mouth.     dicyclomine (BENTYL) 20 MG tablet Take 1 tablet (20 mg total) by mouth 2 (two) times daily. 20 tablet 0   ergocalciferol (VITAMIN D2) 1.25 MG (50000 UT) capsule Take 50,000 Units by mouth every 7 (seven) days.      esomeprazole (NEXIUM) 20 MG capsule Take 20 mg by mouth daily at 12 noon.     rosuvastatin (CRESTOR) 5 MG tablet Take 5 mg by mouth daily.     No current facility-administered medications for this visit.    REVIEW OF SYSTEMS:   Constitutional: ( - ) fevers, ( - )  chills , ( - ) night sweats Eyes: ( - ) blurriness of vision, ( - ) double vision, ( - ) watery eyes Ears, nose, mouth, throat, and face: ( - ) mucositis, ( - ) sore throat Respiratory: ( - ) cough, ( - ) dyspnea, ( - ) wheezes Cardiovascular: ( - ) palpitation, ( - ) chest discomfort, ( - ) lower extremity swelling Gastrointestinal:  ( - ) nausea, ( - ) heartburn, ( - ) change in bowel habits Skin: ( - ) abnormal skin rashes Lymphatics: ( - ) new lymphadenopathy, ( - ) easy bruising Neurological: ( - ) numbness, ( - ) tingling, ( - ) new weaknesses Behavioral/Psych: ( - ) mood change, ( - ) new changes  All other systems were reviewed with the patient and are negative.  PHYSICAL EXAMINATION:  Vitals:   10/22/22 1132  BP: (!) 147/77  Pulse: 74  Resp: 15  Temp: 97.7 F (36.5 C)  SpO2: 99%   Filed Weights   10/22/22 1132  Weight: 239 lb (108.4 kg)    GENERAL: Well-appearing middle-aged Caucasian male ,alert, no distress and comfortable SKIN: skin color, texture, turgor are normal, no rashes or significant lesions EYES: conjunctiva are pink and  non-injected, sclera clear LUNGS: clear to auscultation and percussion with normal breathing effort HEART: regular rate & rhythm and no murmurs and no lower extremity edema Musculoskeletal: no cyanosis of digits and no clubbing  PSYCH: alert & oriented x 3, fluent speech NEURO: no focal motor/sensory deficits  LABORATORY DATA:  I have reviewed the data as listed  Latest Ref Rng & Units 10/22/2022   10:50 AM 02/25/2022   10:16 AM 09/03/2021    9:47 AM  CBC  WBC 4.0 - 10.5 K/uL 7.3  6.2  6.3   Hemoglobin 13.0 - 17.0 g/dL 17.5  17.7  16.9   Hematocrit 39.0 - 52.0 % 50.4  52.0  49.0   Platelets 150 - 400 K/uL 164  155  147        Latest Ref Rng & Units 10/22/2022   10:50 AM 02/25/2022   10:16 AM 09/03/2021    9:47 AM  CMP  Glucose 70 - 99 mg/dL 88  97  97   BUN 8 - 23 mg/dL '28  18  29   '$ Creatinine 0.61 - 1.24 mg/dL 1.41  1.44  1.51   Sodium 135 - 145 mmol/L 136  138  138   Potassium 3.5 - 5.1 mmol/L 4.7  4.8  4.5   Chloride 98 - 111 mmol/L 104  102  105   CO2 22 - 32 mmol/L '27  29  22   '$ Calcium 8.9 - 10.3 mg/dL 9.5  9.9  9.4   Total Protein 6.5 - 8.1 g/dL 7.2  7.1  7.4   Total Bilirubin 0.3 - 1.2 mg/dL 0.5  0.7  0.8   Alkaline Phos 38 - 126 U/L 64  68  81   AST 15 - 41 U/L '18  21  22   '$ ALT 0 - 44 U/L 28  32  31     RADIOGRAPHIC STUDIES: I have personally reviewed the radiological images as listed and agreed with the findings in the report. CUP PACEART REMOTE DEVICE CHECK  Result Date: 09/29/2022 ILR summary report received. Battery status OK. Normal device function. No new symptom, tachy, brady, or pause episodes. No new AF episodes. AF burden is 0% of the time.  Monthly summary reports and ROV/PRN Kathy Breach, RN, CCDS, [employer]   ASSESSMENT & PLAN Gabriel Lin 62 y.o. male with medical history significant for secondary polycythemia who presents for a follow up visit.   # Polycythemia, Secondary -- Findings are most consistent with a secondary polycythemia  due to sleep apnea.  Patient is compliant with his CPAP machine but has had recent adjustments --patient quit vaping 1 month ago.  --Labs today show white blood cell 7.3, hemoglobin 17.5, hematocrit 50.4, and platelets of 164. --No indication for phlebotomy or cytoreductive therapy. -- Return to clinic in 6 months time or sooner if there are any concerning changes in his health  No orders of the defined types were placed in this encounter.   All questions were answered. The patient knows to call the clinic with any problems, questions or concerns.  A total of more than 30 minutes were spent on this encounter with face-to-face time and non-face-to-face time, including preparing to see the patient, ordering tests and/or medications, counseling the patient and coordination of care as outlined above.   Ledell Peoples, MD Department of Hematology/Oncology [facility] Phone: [phone] Pager: [phone] Email: [email or web address]  10/22/2022 1:36 PM

## 2022-10-24 DIAGNOSIS — N401 Enlarged prostate with lower urinary tract symptoms: Secondary | ICD-10-CM | POA: Diagnosis not present

## 2022-10-24 DIAGNOSIS — R3912 Poor urinary stream: Secondary | ICD-10-CM | POA: Diagnosis not present

## 2022-10-24 DIAGNOSIS — N281 Cyst of kidney, acquired: Secondary | ICD-10-CM | POA: Diagnosis not present

## 2022-10-24 DIAGNOSIS — R3121 Asymptomatic microscopic hematuria: Secondary | ICD-10-CM | POA: Diagnosis not present

## 2022-10-31 LAB — CUP PACEART REMOTE DEVICE CHECK
Date Time Interrogation Session: 20240110231058
Implantable Pulse Generator Implant Date: 20211123

## 2022-11-01 NOTE — Progress Notes (Signed)
[facility] Summary Report / Loop Recorder

## 2022-11-04 ENCOUNTER — Ambulatory Visit (INDEPENDENT_AMBULATORY_CARE_PROVIDER_SITE_OTHER): Payer: BC Managed Care – PPO

## 2022-11-04 DIAGNOSIS — G459 Transient cerebral ischemic attack, unspecified: Secondary | ICD-10-CM

## 2022-11-11 ENCOUNTER — Ambulatory Visit (INDEPENDENT_AMBULATORY_CARE_PROVIDER_SITE_OTHER): Payer: BC Managed Care – PPO | Admitting: Podiatry

## 2022-11-11 ENCOUNTER — Ambulatory Visit (INDEPENDENT_AMBULATORY_CARE_PROVIDER_SITE_OTHER): Payer: BC Managed Care – PPO

## 2022-11-11 ENCOUNTER — Encounter: Payer: Self-pay | Admitting: Podiatry

## 2022-11-11 DIAGNOSIS — M109 Gout, unspecified: Secondary | ICD-10-CM

## 2022-11-11 DIAGNOSIS — M779 Enthesopathy, unspecified: Secondary | ICD-10-CM

## 2022-11-11 MED ORDER — TRIAMCINOLONE ACETONIDE 10 MG/ML IJ SUSP
10.0000 mg | Freq: Once | INTRAMUSCULAR | Status: AC
  Administered 2022-11-11: 10 mg

## 2022-11-11 MED ORDER — PREDNISONE 10 MG PO TABS: ORAL_TABLET | ORAL | 0 refills | Status: DC

## 2022-11-11 NOTE — Progress Notes (Signed)
Subjective:   Patient ID: Gabriel Lin, male   DOB: 62 y.o.   MRN: [id number]   HPI Patient states he has developed severe discomfort in his right ankle and foot over the last few days and it is shooting and he really has trouble bearing any weight down.  Does not remember specific injury was in [place] kind of when it started and does think maybe that he ate shellfish prior to the event.  Patient does not smoke likes to be active   ROS      Objective:  Physical Exam  Neurovascular status intact with exquisite discomfort into the sinus tarsi right and more distal to this with edema in the foot and ankle right negative Bevelyn Buckles' sign noted.  Very tender when pressed with what appears to be some form of systemic inflammatory process     Assessment:  Possibility that this is a acute gout flareup versus other unknown inflammatory systemic flareup versus localized inflammatory process with sinus tarsitis     Plan:  H&P x-ray reviewed condition discussed great length.  Today I did go ahead did a sterile prep injected the capsule of the sinus tarsi subtalar joint 3 mg Kenalog 5 mg Xylocaine applied Ace wrap instructed on air fracture walker usage which was fitted properly to his lower leg.  Placed on Sterapred DS Dosepak and I am sending for arthritic profile to rule out gout or any other arthritides  X-rays were negative for signs of fracture appears to be some form of soft tissue inflammatory process

## 2022-11-12 LAB — COMPREHENSIVE METABOLIC PANEL
AG Ratio: 1.6 (calc) (ref 1.0–2.5)
ALT: 24 U/L (ref 9–46)
AST: 15 U/L (ref 10–35)
Albumin: 4.2 g/dL (ref 3.6–5.1)
Alkaline phosphatase (APISO): 83 U/L (ref 35–144)
BUN/Creatinine Ratio: 22 (calc) (ref 6–22)
BUN: 35 mg/dL — ABNORMAL HIGH (ref 7–25)
CO2: 27 mmol/L (ref 20–32)
Calcium: 9.2 mg/dL (ref 8.6–10.3)
Chloride: 102 mmol/L (ref 98–110)
Creat: 1.57 mg/dL — ABNORMAL HIGH (ref 0.70–1.35)
Globulin: 2.6 g/dL (calc) (ref 1.9–3.7)
Glucose, Bld: 98 mg/dL (ref 65–99)
Potassium: 4.1 mmol/L (ref 3.5–5.3)
Sodium: 140 mmol/L (ref 135–146)
Total Bilirubin: 0.4 mg/dL (ref 0.2–1.2)
Total Protein: 6.8 g/dL (ref 6.1–8.1)

## 2022-11-12 LAB — CBC WITH DIFFERENTIAL/PLATELET
Absolute Monocytes: 670 cells/uL (ref 200–950)
Basophils Absolute: 58 cells/uL (ref 0–200)
Basophils Relative: 0.8 %
Eosinophils Absolute: 151 cells/uL (ref 15–500)
Eosinophils Relative: 2.1 %
HCT: 48.5 % (ref 38.5–50.0)
Hemoglobin: 17 g/dL (ref 13.2–17.1)
Lymphs Abs: 1498 cells/uL (ref 850–3900)
MCH: 31.5 pg (ref 27.0–33.0)
MCHC: 35.1 g/dL (ref 32.0–36.0)
MCV: 90 fL (ref 80.0–100.0)
MPV: 9.3 fL (ref 7.5–12.5)
Monocytes Relative: 9.3 %
Neutro Abs: 4824 cells/uL (ref 1500–7800)
Neutrophils Relative %: 67 %
Platelets: 158 10*3/uL (ref 140–400)
RBC: 5.39 10*6/uL (ref 4.20–5.80)
RDW: 12.8 % (ref 11.0–15.0)
Total Lymphocyte: 20.8 %
WBC: 7.2 10*3/uL (ref 3.8–10.8)

## 2022-11-12 LAB — C-REACTIVE PROTEIN: CRP: 23.3 mg/L — ABNORMAL HIGH (ref <8.0)

## 2022-11-12 LAB — RHEUMATOID FACTOR: Rheumatoid fact SerPl-aCnc: <14 IU/mL (ref <14)

## 2022-11-12 LAB — SEDIMENTATION RATE: Sed Rate: 22 mm/h — ABNORMAL HIGH (ref 0–20)

## 2022-11-12 LAB — URIC ACID: Uric Acid, Serum: 9.4 mg/dL — ABNORMAL HIGH (ref 4.0–8.0)

## 2022-11-20 ENCOUNTER — Ambulatory Visit (INDEPENDENT_AMBULATORY_CARE_PROVIDER_SITE_OTHER): Payer: BC Managed Care – PPO | Admitting: Podiatry

## 2022-11-20 DIAGNOSIS — M7752 Other enthesopathy of left foot: Secondary | ICD-10-CM | POA: Diagnosis not present

## 2022-11-20 DIAGNOSIS — M109 Gout, unspecified: Secondary | ICD-10-CM

## 2022-11-20 DIAGNOSIS — M779 Enthesopathy, unspecified: Secondary | ICD-10-CM

## 2022-11-20 MED ORDER — ALLOPURINOL 100 MG PO TABS: 100.0000 mg | ORAL_TABLET | Freq: Every day | ORAL | 6 refills | Status: DC

## 2022-11-20 NOTE — Progress Notes (Signed)
Subjective:   Patient ID: Gabriel Lin, male   DOB: 62 y.o.   MRN: [id number]   HPI Patient presents with improved pain in the right ankle and midfoot stating that wants to review blood work today   ROS      Objective:  Physical Exam  Neurovascular status intact inflammation reduced with pain only upon deep palpation at this point     Assessment:  Improvement of probable gout symptomatology right with elevated uric acid     Plan:  H&P reviewed uric acid results and do think ultimately we will have to have a EBL medicine and patient wants to start it at this time which I think is a smart move.  I went ahead and placed this patient on allopurinol with all instructions discussed foods to avoid and what to look for.  Patient will be seen back to recheck

## 2022-12-05 ENCOUNTER — Encounter: Payer: Self-pay | Admitting: Podiatry

## 2022-12-05 ENCOUNTER — Ambulatory Visit (INDEPENDENT_AMBULATORY_CARE_PROVIDER_SITE_OTHER): Payer: BC Managed Care – PPO | Admitting: Podiatry

## 2022-12-05 DIAGNOSIS — M779 Enthesopathy, unspecified: Secondary | ICD-10-CM | POA: Diagnosis not present

## 2022-12-05 LAB — CUP PACEART REMOTE DEVICE CHECK
Date Time Interrogation Session: 20240212230716
Implantable Pulse Generator Implant Date: 20211123

## 2022-12-05 MED ORDER — TRIAMCINOLONE ACETONIDE 10 MG/ML IJ SUSP
10.0000 mg | Freq: Once | INTRAMUSCULAR | Status: AC
  Administered 2022-12-05: 10 mg

## 2022-12-05 NOTE — Progress Notes (Signed)
Subjective:   Patient ID: Gabriel Lin, male   DOB: 62 y.o.   MRN: [id number]   HPI Patient states he was doing fine and then all of a sudden started develop a lot of severe pain in his left outside foot that is been very sore last couple days like the right foot.  He has been taking his gout medicine but only has started over the last 2 weeks   ROS      Objective:  Physical Exam  Neurovascular status intact muscle strength found to be adequate inflammation pain around the lateral foot within the peroneal complex with swelling like he had had on the other foot with high uric acid noted on his blood work     Assessment:  Probability that this is an inflammatory condition and related also to gout     Plan:  Reviewed and went ahead today injected the lateral tendon complex 3 mg Kenalog 5 mg Xylocaine advised on ice continue taking his medicine reappoint as symptoms indicate  X-rays are negative on the left foot for signs of fracture or arthritis

## 2022-12-09 ENCOUNTER — Ambulatory Visit: Payer: BC Managed Care – PPO | Admitting: Podiatry

## 2022-12-09 ENCOUNTER — Ambulatory Visit: Payer: BC Managed Care – PPO

## 2022-12-09 DIAGNOSIS — G459 Transient cerebral ischemic attack, unspecified: Secondary | ICD-10-CM | POA: Diagnosis not present

## 2022-12-12 NOTE — Progress Notes (Signed)
Carelink Summary Report / Loop Recorder 

## 2023-01-05 LAB — CUP PACEART REMOTE DEVICE CHECK
Date Time Interrogation Session: 20240316231117
Implantable Pulse Generator Implant Date: 20211123

## 2023-01-13 ENCOUNTER — Ambulatory Visit (INDEPENDENT_AMBULATORY_CARE_PROVIDER_SITE_OTHER): Payer: BC Managed Care – PPO

## 2023-01-13 DIAGNOSIS — G459 Transient cerebral ischemic attack, unspecified: Secondary | ICD-10-CM

## 2023-01-20 NOTE — Progress Notes (Signed)
Carelink Summary Report / Loop Recorder 

## 2023-02-17 ENCOUNTER — Ambulatory Visit (INDEPENDENT_AMBULATORY_CARE_PROVIDER_SITE_OTHER): Payer: BC Managed Care – PPO

## 2023-02-17 DIAGNOSIS — I639 Cerebral infarction, unspecified: Secondary | ICD-10-CM | POA: Diagnosis not present

## 2023-02-17 LAB — CUP PACEART REMOTE DEVICE CHECK
Date Time Interrogation Session: 20240428231034
Implantable Pulse Generator Implant Date: 20211123

## 2023-02-20 NOTE — Progress Notes (Signed)
Carelink Summary Report / Loop Recorder 

## 2023-03-12 ENCOUNTER — Ambulatory Visit: Payer: BC Managed Care – PPO | Admitting: Internal Medicine

## 2023-03-13 DIAGNOSIS — M65331 Trigger finger, right middle finger: Secondary | ICD-10-CM | POA: Diagnosis not present

## 2023-03-19 NOTE — Progress Notes (Signed)
Carelink Summary Report / Loop Recorder 

## 2023-03-24 ENCOUNTER — Ambulatory Visit (INDEPENDENT_AMBULATORY_CARE_PROVIDER_SITE_OTHER): Payer: BC Managed Care – PPO

## 2023-03-24 DIAGNOSIS — I639 Cerebral infarction, unspecified: Secondary | ICD-10-CM

## 2023-03-24 LAB — CUP PACEART REMOTE DEVICE CHECK
Date Time Interrogation Session: 20240602230415
Implantable Pulse Generator Implant Date: 20211123

## 2023-04-15 NOTE — Progress Notes (Signed)
Carelink Summary Report / Loop Recorder 

## 2023-04-25 ENCOUNTER — Telehealth: Payer: Self-pay | Admitting: Hematology and Oncology

## 2023-04-25 NOTE — Telephone Encounter (Signed)
Patient called to cancel appointment, patient stated they would call back when they were ready to reschedule appointment.

## 2023-04-28 ENCOUNTER — Ambulatory Visit (INDEPENDENT_AMBULATORY_CARE_PROVIDER_SITE_OTHER): Payer: BC Managed Care – PPO

## 2023-04-28 DIAGNOSIS — I639 Cerebral infarction, unspecified: Secondary | ICD-10-CM

## 2023-04-28 LAB — CUP PACEART REMOTE DEVICE CHECK
Date Time Interrogation Session: 20240705230217
Implantable Pulse Generator Implant Date: 20211123

## 2023-04-29 ENCOUNTER — Ambulatory Visit: Payer: BC Managed Care – PPO | Admitting: Hematology and Oncology

## 2023-04-29 ENCOUNTER — Other Ambulatory Visit: Payer: Self-pay | Admitting: Podiatry

## 2023-04-29 ENCOUNTER — Encounter: Payer: Self-pay | Admitting: Podiatry

## 2023-04-29 ENCOUNTER — Other Ambulatory Visit: Payer: BC Managed Care – PPO

## 2023-04-29 MED ORDER — COLCHICINE 0.6 MG PO TABS
0.6000 mg | ORAL_TABLET | Freq: Every day | ORAL | 0 refills | Status: DC
Start: 2023-04-29 — End: 2023-05-22

## 2023-04-29 MED ORDER — ALLOPURINOL 100 MG PO TABS: 100.0000 mg | ORAL_TABLET | Freq: Every day | ORAL | 6 refills | Status: DC

## 2023-05-13 NOTE — Progress Notes (Signed)
Carelink Summary Report / Loop Recorder 

## 2023-05-22 ENCOUNTER — Other Ambulatory Visit: Payer: Self-pay | Admitting: Podiatry

## 2023-06-02 ENCOUNTER — Ambulatory Visit: Payer: BC Managed Care – PPO

## 2023-06-02 DIAGNOSIS — I639 Cerebral infarction, unspecified: Secondary | ICD-10-CM

## 2023-06-13 NOTE — Progress Notes (Signed)
Carelink Summary Report / Loop Recorder 

## 2023-06-18 ENCOUNTER — Encounter: Payer: Self-pay | Admitting: Podiatry

## 2023-06-20 ENCOUNTER — Encounter: Payer: Self-pay | Admitting: Podiatry

## 2023-06-20 ENCOUNTER — Ambulatory Visit (INDEPENDENT_AMBULATORY_CARE_PROVIDER_SITE_OTHER): Payer: BC Managed Care – PPO | Admitting: Podiatry

## 2023-06-20 DIAGNOSIS — M7751 Other enthesopathy of right foot: Secondary | ICD-10-CM | POA: Diagnosis not present

## 2023-06-20 MED ORDER — TRIAMCINOLONE ACETONIDE 10 MG/ML IJ SUSP
10.0000 mg | Freq: Once | INTRAMUSCULAR | Status: AC
Start: 2023-06-20 — End: 2023-06-20
  Administered 2023-06-20: 10 mg via INTRA_ARTICULAR

## 2023-06-20 MED ORDER — METHYLPREDNISOLONE 4 MG PO TBPK
ORAL_TABLET | ORAL | 0 refills | Status: DC
Start: 2023-06-20 — End: 2023-12-30

## 2023-06-20 NOTE — Progress Notes (Signed)
Subjective:   Patient ID: Gabriel Lin, male   DOB: 62 y.o.   MRN: [id number]   HPI Patient presents stating developing a lot of pain in the outside of the right foot gout attack first attack of had an approximate 7 months   ROS      Objective:  Physical Exam  Neurovascular status intact inflammation pain of the sinus tarsi but especially of the peroneal group lateral right ankle     Assessment:  Inflammatory condition most likely related to gout and tendinitis     Plan:  Sterile prep injected the tendon complex 3 mg Kenalog 5 mg Xylocaine advised on ice therapy placed on Medrol Dosepak reappoint to recheck as needed

## 2023-07-07 ENCOUNTER — Ambulatory Visit (INDEPENDENT_AMBULATORY_CARE_PROVIDER_SITE_OTHER): Payer: BC Managed Care – PPO

## 2023-07-07 DIAGNOSIS — I639 Cerebral infarction, unspecified: Secondary | ICD-10-CM

## 2023-07-07 LAB — CUP PACEART REMOTE DEVICE CHECK
Date Time Interrogation Session: 20240913230917
Implantable Pulse Generator Implant Date: 20211123

## 2023-07-21 NOTE — Progress Notes (Signed)
Carelink Summary Report / Loop Recorder 

## 2023-08-11 ENCOUNTER — Ambulatory Visit (INDEPENDENT_AMBULATORY_CARE_PROVIDER_SITE_OTHER): Payer: BC Managed Care – PPO

## 2023-08-11 DIAGNOSIS — I639 Cerebral infarction, unspecified: Secondary | ICD-10-CM | POA: Diagnosis not present

## 2023-08-11 LAB — CUP PACEART REMOTE DEVICE CHECK
Date Time Interrogation Session: 20241020230447
Implantable Pulse Generator Implant Date: 20211123

## 2023-08-26 NOTE — Progress Notes (Signed)
Carelink Summary Report / Loop Recorder 

## 2023-09-15 ENCOUNTER — Ambulatory Visit: Payer: BC Managed Care – PPO

## 2023-09-15 DIAGNOSIS — I639 Cerebral infarction, unspecified: Secondary | ICD-10-CM | POA: Diagnosis not present

## 2023-09-17 LAB — CUP PACEART REMOTE DEVICE CHECK
Date Time Interrogation Session: 20241126110858
Implantable Pulse Generator Implant Date: 20211123

## 2023-10-09 NOTE — Addendum Note (Signed)
Addended by: Geralyn Flash D on: 10/09/2023 09:29 AM   Modules accepted: Orders

## 2023-10-09 NOTE — Progress Notes (Signed)
Carelink Summary Report / Loop Recorder 

## 2023-10-20 ENCOUNTER — Ambulatory Visit (INDEPENDENT_AMBULATORY_CARE_PROVIDER_SITE_OTHER): Payer: BC Managed Care – PPO

## 2023-10-20 DIAGNOSIS — I639 Cerebral infarction, unspecified: Secondary | ICD-10-CM

## 2023-10-20 DIAGNOSIS — G4733 Obstructive sleep apnea (adult) (pediatric): Secondary | ICD-10-CM | POA: Diagnosis not present

## 2023-10-20 LAB — CUP PACEART REMOTE DEVICE CHECK
Date Time Interrogation Session: 20241229230523
Implantable Pulse Generator Implant Date: 20211123

## 2023-11-24 ENCOUNTER — Ambulatory Visit (INDEPENDENT_AMBULATORY_CARE_PROVIDER_SITE_OTHER): Payer: BC Managed Care – PPO

## 2023-11-24 DIAGNOSIS — G459 Transient cerebral ischemic attack, unspecified: Secondary | ICD-10-CM

## 2023-11-24 LAB — CUP PACEART REMOTE DEVICE CHECK
Date Time Interrogation Session: 20250202230303
Implantable Pulse Generator Implant Date: 20211123

## 2023-12-29 ENCOUNTER — Ambulatory Visit (INDEPENDENT_AMBULATORY_CARE_PROVIDER_SITE_OTHER): Payer: BC Managed Care – PPO

## 2023-12-29 DIAGNOSIS — G459 Transient cerebral ischemic attack, unspecified: Secondary | ICD-10-CM

## 2023-12-29 LAB — CUP PACEART REMOTE DEVICE CHECK
Date Time Interrogation Session: 20250309230331
Implantable Pulse Generator Implant Date: 20211123

## 2023-12-30 ENCOUNTER — Telehealth: Admitting: Physician Assistant

## 2023-12-30 DIAGNOSIS — M109 Gout, unspecified: Secondary | ICD-10-CM

## 2023-12-30 MED ORDER — PREDNISONE 20 MG PO TABS: 40.0000 mg | ORAL_TABLET | Freq: Every day | ORAL | 0 refills | Status: AC

## 2023-12-30 NOTE — Progress Notes (Signed)
 Virtual Visit Consent   Gabriel Lin, you are scheduled for a virtual visit with a [facility] provider today. Just as with appointments in the office, your consent must be obtained to participate. Your consent will be active for this visit and any virtual visit you may have with one of our providers in the next 365 days. If you have a MyChart account, a copy of this consent can be sent to you electronically.  As this is a virtual visit, video technology does not allow for your provider to perform a traditional examination. This may limit your provider's ability to fully assess your condition. If your provider identifies any concerns that need to be evaluated in person or the need to arrange testing (such as labs, EKG, etc.), we will make arrangements to do so. Although advances in technology are sophisticated, we cannot ensure that it will always work on either your end or our end. If the connection with a video visit is poor, the visit may have to be switched to a telephone visit. With either a video or telephone visit, we are not always able to ensure that we have a secure connection.  By engaging in this virtual visit, you consent to the provision of healthcare and authorize for your insurance to be billed (if applicable) for the services provided during this visit. Depending on your insurance coverage, you may receive a charge related to this service.  I need to obtain your verbal consent now. Are you willing to proceed with your visit today? Gabriel Lin has provided verbal consent on 12/30/2023 for a virtual visit (video or telephone). Gabriel Lin, [place]  Date: 12/30/2023 1:43 PM   Virtual Visit via Video Note   I, Gabriel Lin, connected with  Gabriel Lin  ([id number], 11/04/1960) on 12/30/23 at  1:45 PM EDT by a video-enabled telemedicine application and verified that I am speaking with the correct person using two identifiers.  Location: Patient: Virtual Visit Location  Patient: Home Provider: Virtual Visit Location Provider: Home Office   I discussed the limitations of evaluation and management by telemedicine and the availability of in person appointments. The patient expressed understanding and agreed to proceed.    History of Present Illness: Gabriel Lin is a 63 y.o. who identifies as a male who was assigned male at birth, and is being seen today for gout flare of his R ankle over the past 3 days. Notes minor swelling and redness but substantial tenderness that has been progressing for a few days, worse with walking. Is taking his allopurinol daily as a preventive. Started old Rx Colchicine which he tolerates. Notes it only sometimes works. Typically he has to have a prednisone course to resolve symptoms. Denies change to diet but has been very busy recently and hydrating poorly. Denies alcohol consumption.  HPI: HPI  Problems:  Patient Active Problem List   Diagnosis Date Noted   OSA (obstructive sleep apnea) 11/14/2021   Acute stroke due to ischemia (HCC) 09/12/2020   TIA (transient ischemic attack) 09/10/2020   Family history of colon cancer 07/02/2020   Hx of adenomatous colonic polyps 07/02/2020   Fatty liver disease, nonalcoholic 08/17/2018   CKD (chronic kidney disease) stage 3, GFR 30-59 ml/min (HCC) 05/07/2017   Immunization counseling 07/24/2016   Medication management 07/24/2016   Situational anxiety 05/10/2016   HTN (hypertension), benign 08/17/2012   Acid reflux disease 09/23/2011   Pure hypercholesterolemia 09/23/2011   Hematuria 06/26/2001   Tobacco abuse 06/26/2001   Sleep  apnea 03/28/2000    Allergies: No Known Allergies Medications:  Current Outpatient Medications:    predniSONE (DELTASONE) 20 MG tablet, Take 2 tablets (40 mg total) by mouth daily with breakfast for 7 days. Use for shortest duration needed., Disp: 14 tablet, Rfl: 0   allopurinol (ZYLOPRIM) 100 MG tablet, Take 1 tablet (100 mg total) by mouth daily., Disp: 30  tablet, Rfl: 6   allopurinol (ZYLOPRIM) 100 MG tablet, Take 1 tablet (100 mg total) by mouth daily., Disp: 30 tablet, Rfl: 6   ALPRAZolam (XANAX) 0.25 MG tablet, Take by mouth. Taking 1/2 tablet PO as needed, Disp: , Rfl:    aspirin 81 MG chewable tablet, Chew by mouth daily., Disp: , Rfl:    Aspirin-Acetaminophen-Caffeine (GOODY HEADACHE PO), Take by mouth. As needed, Disp: , Rfl:    atenolol (TENORMIN) 50 MG tablet, Take 50 mg by mouth daily. , Disp: , Rfl:    co-enzyme Q-10 30 MG capsule, Take by mouth., Disp: , Rfl:    colchicine 0.6 MG tablet, TAKE 1 TABLET BY MOUTH EVERY DAY, Disp: 90 tablet, Rfl: 1   dicyclomine (BENTYL) 20 MG tablet, Take 1 tablet (20 mg total) by mouth 2 (two) times daily., Disp: 20 tablet, Rfl: 0   ergocalciferol (VITAMIN D2) 1.25 MG (50000 UT) capsule, Take 50,000 Units by mouth every 7 (seven) days. , Disp: , Rfl:    esomeprazole (NEXIUM) 20 MG capsule, Take 20 mg by mouth daily at 12 noon., Disp: , Rfl:    rosuvastatin (CRESTOR) 5 MG tablet, Take 5 mg by mouth daily., Disp: , Rfl:   Observations/Objective: Patient is well-developed, well-nourished in no acute distress.  Resting comfortably at home.  Head is normocephalic, atraumatic.  No labored breathing. Speech is clear and coherent with logical content.  Patient is alert and oriented at baseline.   Assessment and Plan: 1. Acute gout of right ankle, unspecified cause (Primary) - predniSONE (DELTASONE) 20 MG tablet; Take 2 tablets (40 mg total) by mouth daily with breakfast for 7 days. Use for shortest duration needed.  Dispense: 14 tablet; Refill: 0  Classic symptoms. Acute flare. No alarm signs or symptoms present. Will start burst of prednisone 40 mg daily. OTC medications reviewed. Continue daily allopurinol. PCP follow-up discussed.  Follow Up Instructions: I discussed the assessment and treatment plan with the patient. The patient was provided an opportunity to ask questions and all were answered. The  patient agreed with the plan and demonstrated an understanding of the instructions.  A copy of instructions were sent to the patient via MyChart unless otherwise noted below.    The patient was advised to call back or seek an in-person evaluation if the symptoms worsen or if the condition fails to improve as anticipated.    Gabriel Climes, PA-C

## 2023-12-30 NOTE — Patient Instructions (Signed)
 Gabriel Lin, thank you for joining Piedad Climes, PA-C for today's virtual visit.  While this provider is not your primary care provider (PCP), if your PCP is located in our provider database this encounter information will be shared with them immediately following your visit.   A [facility] MyChart account gives you access to today's visit and all your visits, tests, and labs performed at [facility] " click here if you don't have a [facility] MyChart account or go to [email or web address]  Consent: (Patient) Gabriel Lin provided verbal consent for this virtual visit at the beginning of the encounter.  Current Medications:  Current Outpatient Medications:    allopurinol (ZYLOPRIM) 100 MG tablet, Take 1 tablet (100 mg total) by mouth daily., Disp: 30 tablet, Rfl: 6   allopurinol (ZYLOPRIM) 100 MG tablet, Take 1 tablet (100 mg total) by mouth daily., Disp: 30 tablet, Rfl: 6   ALPRAZolam (XANAX) 0.25 MG tablet, Take by mouth. Taking 1/2 tablet PO as needed, Disp: , Rfl:    aspirin 81 MG chewable tablet, Chew by mouth daily., Disp: , Rfl:    Aspirin-Acetaminophen-Caffeine (GOODY HEADACHE PO), Take by mouth. As needed, Disp: , Rfl:    atenolol (TENORMIN) 50 MG tablet, Take 50 mg by mouth daily. , Disp: , Rfl:    co-enzyme Q-10 30 MG capsule, Take by mouth., Disp: , Rfl:    colchicine 0.6 MG tablet, TAKE 1 TABLET BY MOUTH EVERY DAY, Disp: 90 tablet, Rfl: 1   dicyclomine (BENTYL) 20 MG tablet, Take 1 tablet (20 mg total) by mouth 2 (two) times daily., Disp: 20 tablet, Rfl: 0   ergocalciferol (VITAMIN D2) 1.25 MG (50000 UT) capsule, Take 50,000 Units by mouth every 7 (seven) days. , Disp: , Rfl:    esomeprazole (NEXIUM) 20 MG capsule, Take 20 mg by mouth daily at 12 noon., Disp: , Rfl:    methylPREDNISolone (MEDROL DOSEPAK) 4 MG TBPK tablet, follow package directions, Disp: 21 tablet, Rfl: 0   predniSONE (DELTASONE) 10 MG tablet, 12 day tapering dose, Disp: 48 tablet,  Rfl: 0   rosuvastatin (CRESTOR) 5 MG tablet, Take 5 mg by mouth daily., Disp: , Rfl:    Medications ordered in this encounter:  No orders of the defined types were placed in this encounter.    *If you need refills on other medications prior to your next appointment, please contact your pharmacy*  Follow-Up: Call back or seek an in-person evaluation if the symptoms worsen or if the condition fails to improve as anticipated.   Virtual Care [phone]  Other Instructions Gout  Gout is painful swelling of your joints. Gout is a type of arthritis. It is caused by having too much uric acid in your body. Uric acid is a chemical that is made when your body breaks down substances called purines. If your body has too much uric acid, sharp crystals can form and build up in your joints. This causes pain and swelling. Gout attacks can happen quickly and be very painful (acute gout). Over time, the attacks can affect more joints and happen more often (chronic gout). What are the causes? Gout is caused by too much uric acid in your blood. This can happen because: Your kidneys do not remove enough uric acid from your blood. Your body makes too much uric acid. You eat too many foods that are high in purines. These foods include organ meats, some seafood, and beer. Trauma or stress can bring on an attack. What increases  the risk? Having a family history of gout. Being male and middle-aged. Being male and having gone through menopause. Having an organ transplant. Taking certain medicines. Having certain conditions, such as: Being very overweight (obese). Lead poisoning. Kidney disease. A skin condition called psoriasis. Other risks include: Losing weight too quickly. Not having enough water in the body (being dehydrated). Drinking alcohol, especially beer. Drinking beverages that are sweetened with a type of sugar called fructose. What are the signs or symptoms? An attack of  acute gout often starts at night and usually happens in just one joint. The most common place is the big toe. Other joints that may be affected include joints of the feet, ankle, knee, fingers, wrist, or elbow. Symptoms may include: Very bad pain. Warmth. Swelling. Stiffness. Tenderness. The affected joint may be very painful to touch. Shiny, red, or purple skin. Chills and fever. Chronic gout may cause symptoms more often. More joints may be involved. You may also have white or yellow lumps (tophi) on your hands or feet or in other areas near your joints. How is this treated? Treatment for an acute attack may include medicines for pain and swelling, such as: NSAIDs, such as ibuprofen. Steroids taken by mouth or injected into a joint. Colchicine. This can be given by mouth or through an IV tube. Treatment to prevent future attacks may include: Taking small doses of NSAIDs or colchicine daily. Using a medicine that reduces uric acid levels in your blood, such as allopurinol. Making changes to your diet. You may need to see a food expert (dietitian) about what to eat and drink to prevent gout. Follow these instructions at home: During a gout attack  If told, put ice on the painful area. To do this: Put ice in a plastic bag. Place a towel between your skin and the bag. Leave the ice on for 20 minutes, 2-3 times a day. Take off the ice if your skin turns bright red. This is very important. If you cannot feel pain, heat, or cold, you have a greater risk of damage to the area. Raise the painful joint above the level of your heart as often as you can. Rest the joint as much as possible. If the joint is in your leg, you may be given crutches. Follow instructions from your doctor about what you cannot eat or drink. Avoiding future gout attacks Eat a low-purine diet. Avoid foods and drinks such as: Liver. Kidney. Anchovies. Asparagus. Herring. Mushrooms. Mussels. Beer. Stay at a healthy  weight. If you want to lose weight, talk with your doctor. Do not lose weight too fast. Start or continue an exercise plan as told by your doctor. Eating and drinking Avoid drinks sweetened by fructose. Drink enough fluids to keep your pee (urine) pale yellow. If you drink alcohol: Limit how much you have to: 0-1 drink a day for women who are not pregnant. 0-2 drinks a day for men. Know how much alcohol is in a drink. In the [place] one drink equals one 12 oz bottle of beer (355 mL), one 5 oz glass of wine (148 mL), or one 1 oz glass of hard liquor (44 mL). General instructions Take over-the-counter and prescription medicines only as told by your doctor. Ask your doctor if you should avoid driving or using machines while you are taking your medicine. Return to your normal activities when your doctor says that it is safe. Keep all follow-up visits. Where to find more information [employer]: [email or web address].http://www.myers.net/  Contact a doctor if: You have another gout attack. You still have symptoms of a gout attack after 10 days of treatment. You have problems (side effects) because of your medicines. You have chills or a fever. You have burning pain when you pee (urinate). You have pain in your lower back or belly. Get help right away if: You have very bad pain. Your pain cannot be controlled. You cannot pee. Summary Gout is painful swelling of the joints. The most common site of pain is the big toe, but it can affect other joints. Medicines and avoiding some foods can help to prevent and treat gout attacks. This information is not intended to replace advice given to you by your health care provider. Make sure you discuss any questions you have with your health care provider. Document Revised: 07/11/2021 Document Reviewed: 07/11/2021 Elsevier Patient Education  2024 Elsevier Inc.   If you have been instructed to have an in-person evaluation today at a local Urgent Care  facility, please use the link below. It will take you to a list of all of our available [facility], including address, phone number and hours of operation. Please do not delay care.  [facility]  If you or a family member do not have a primary care provider, use the link below to schedule a visit and establish care. When you choose a [facility] primary care physician or advanced practice provider, you gain a long-term partner in health. Find a Primary Care Provider  Learn more about [facility]'s in-office and virtual care options: [facility] - Get Care Now

## 2023-12-30 NOTE — Progress Notes (Signed)
 Carelink Summary Report / Loop Recorder

## 2024-01-01 ENCOUNTER — Ambulatory Visit: Admitting: Podiatry

## 2024-01-27 IMAGING — CT CT ABD-PELV W/ CM
2 of 5 series · 16 of 46 positions shown, 18 images · IV contrast (Omnipaque)
Comparison: None Available.

CLINICAL DATA: Pain right lower quadrant

EXAM:
CT ABDOMEN AND PELVIS WITH CONTRAST
TECHNIQUE: Multidetector CT imaging of the abdomen and pelvis was performed
using the standard protocol following bolus administration of
intravenous contrast.

[Series 2: axial st · axial · 0.97mm/px · z∈[+672,+1082]mm · 13 of 92 slices shown, 15 images]
[im 5/92  soft-tissue]
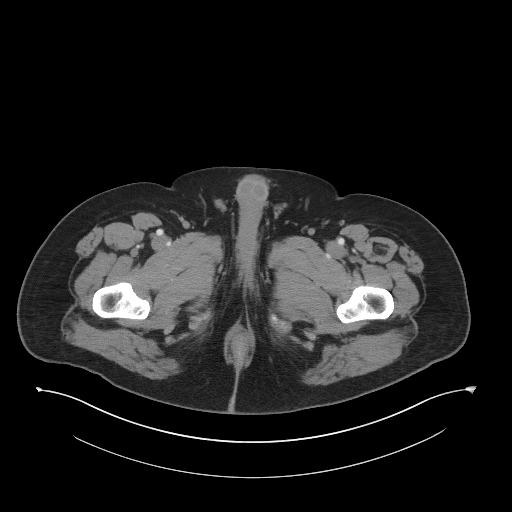
[im 5/92  bone]
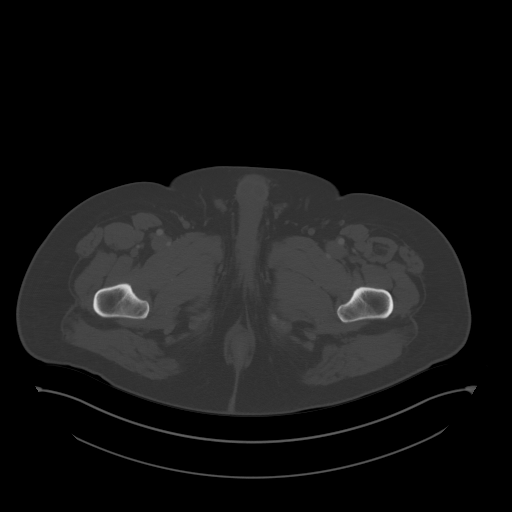
[im 15/92  soft-tissue]
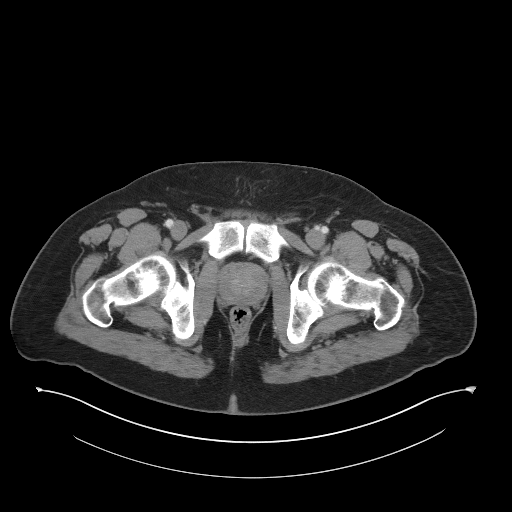
[im 20/92  soft-tissue]
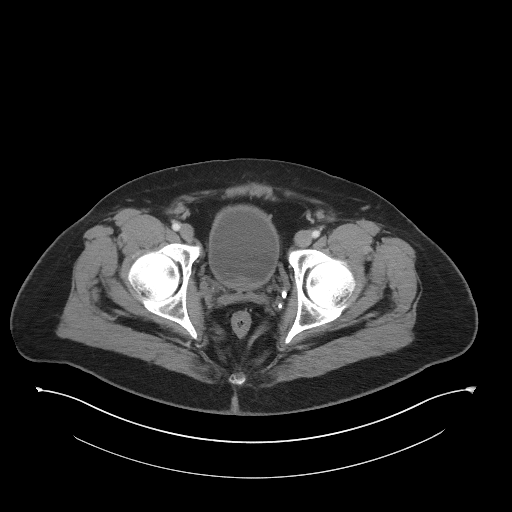
[im 24/92  soft-tissue]
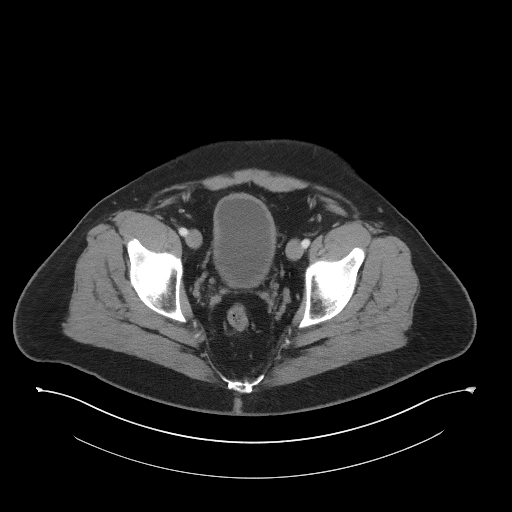
[im 34/92  soft-tissue]
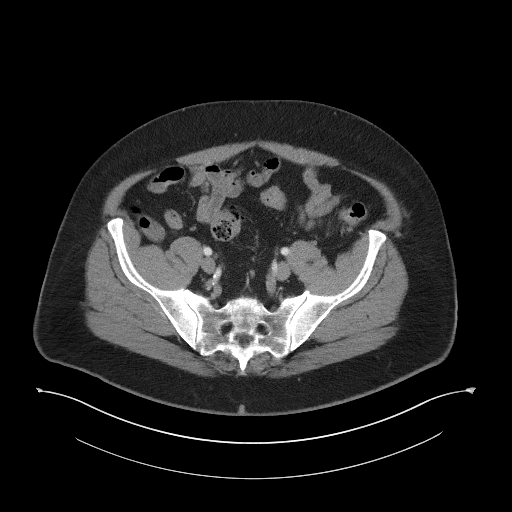
[im 39/92  soft-tissue]
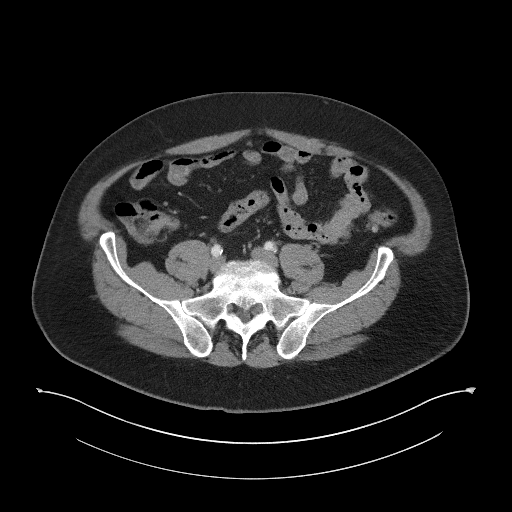
[im 48/92  soft-tissue]
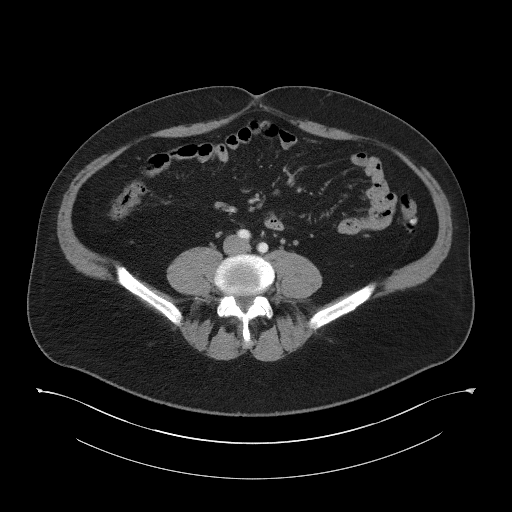
[im 53/92  soft-tissue]
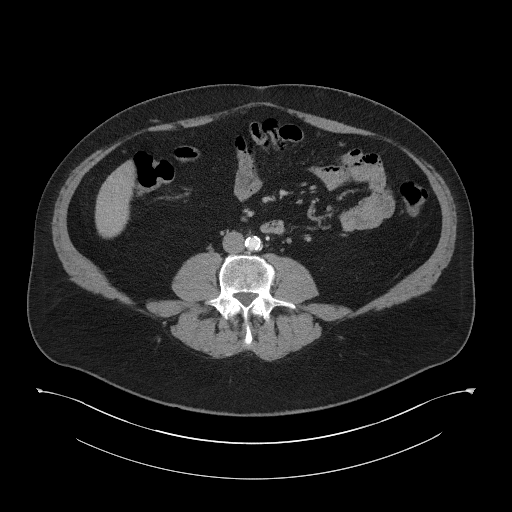
[im 58/92  soft-tissue]
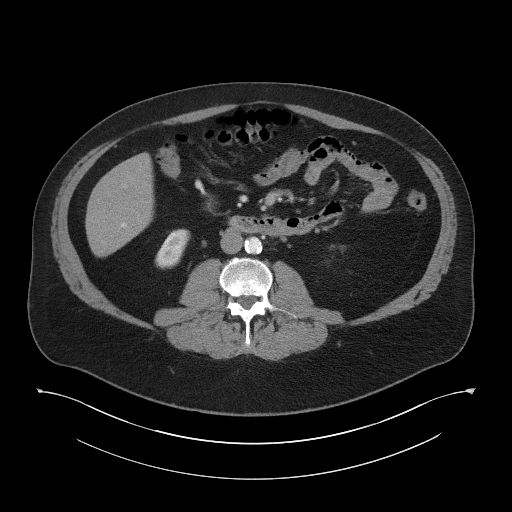
[im 58/92  bone]
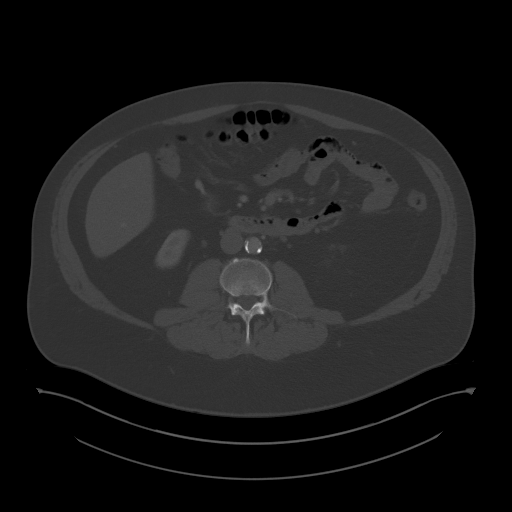
[im 68/92  soft-tissue]
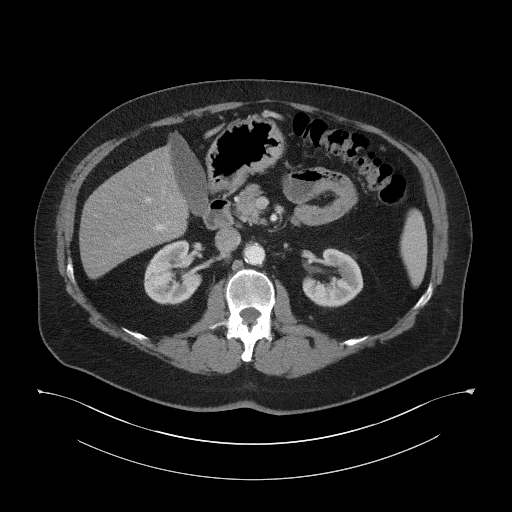
[im 72/92  soft-tissue]
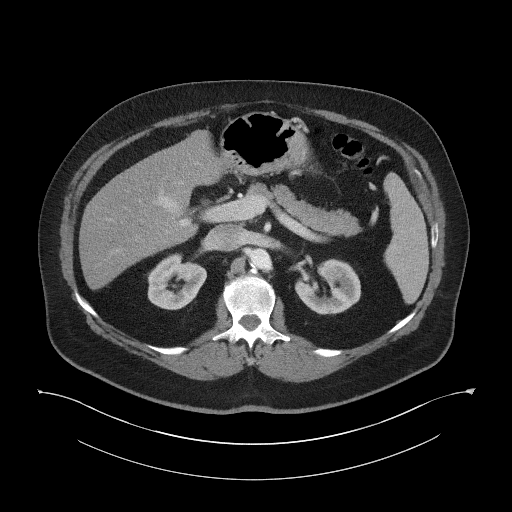
[im 77/92  soft-tissue]
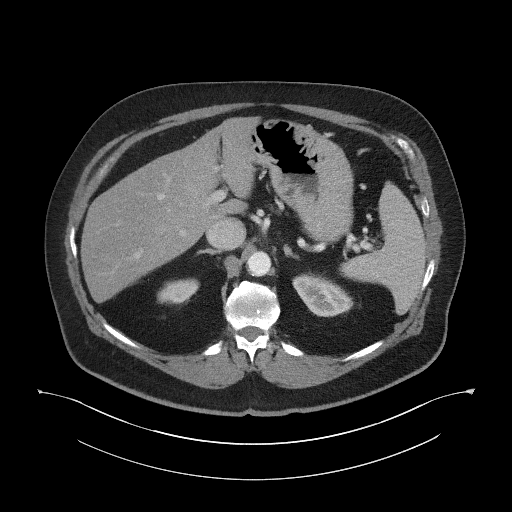
[im 87/92  soft-tissue]
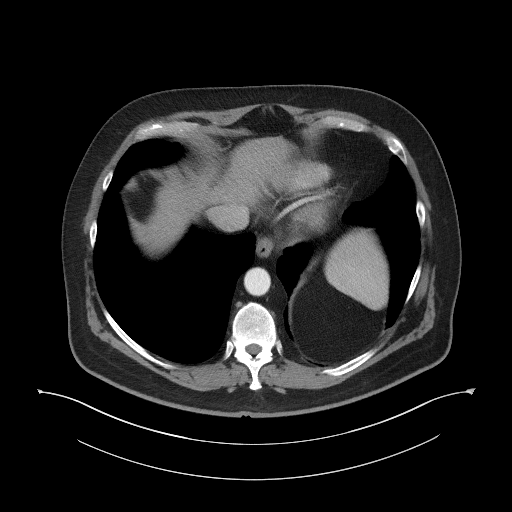

[Series 5: coronal st · coronal · 1.00mm/px · 3 of 121 slices shown]
[im 41/121  soft-tissue]
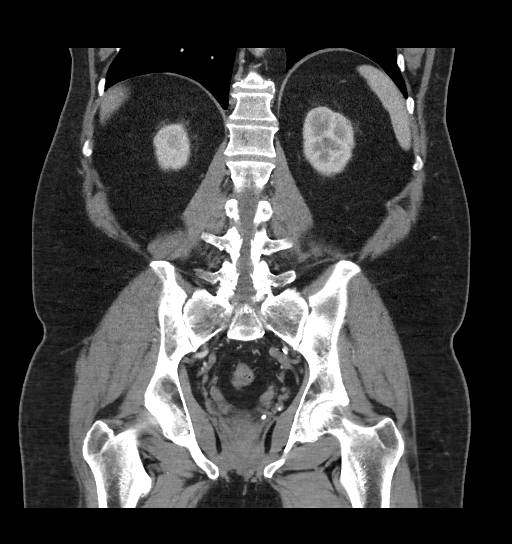
[im 54/121  soft-tissue]
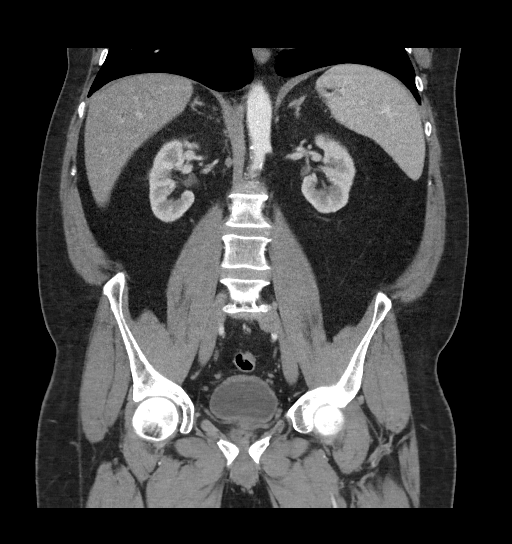
[im 67/121  soft-tissue]
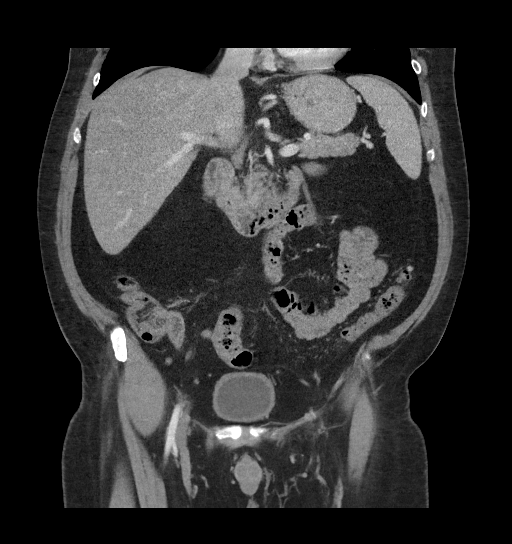

[16 of 46 positions shown; findings below may reference images not displayed]

RADIATION DOSE REDUCTION: This exam was performed according to the
departmental dose-optimization program which includes automated
exposure control, adjustment of the mA and/or kV according to
patient size and/or use of iterative reconstruction technique.

CONTRAST:  100mL OMNIPAQUE IOHEXOL 300 MG/ML  SOLN
FINDINGS: Lower chest: Left hemidiaphragm is elevated. Visualized lower lung
fields are clear.

Hepatobiliary: There is fatty infiltration in the liver. No focal
abnormality is seen. There is no dilation of bile ducts. Gallbladder
is unremarkable. There is no fluid around the gallbladder.

Pancreas: No focal abnormality is seen.

Spleen: Spleen measures 13.9 cm in maximum diameter.

Adrenals/Urinary Tract: Adrenals are unremarkable. There is no
hydronephrosis. Are no renal or ureteral stones. There is 2.3 cm
smooth marginated fluid density lesion in the upper pole of left
kidney suggesting renal cyst. There is possible subcentimeter cyst
in the lower pole of right kidney. Urinary bladder is not distended.
There is mild diffuse wall thickening in the bladder. This may be
due to incomplete distention or suggest chronic cystitis or chronic
outlet obstruction.

Stomach/Bowel: Stomach is unremarkable. Small bowel loops are not
dilated. Appendix is not dilated. There is no significant wall
thickening in colon. Scattered diverticula are seen in the colon.
There is no pericolic stranding.

Vascular/Lymphatic: Scattered arterial calcifications and
atherosclerotic plaques are seen in the aorta and its major
branches.

Reproductive: Prostate is enlarged.

Other: There is no ascites or pneumoperitoneum. Small umbilical
hernia containing fat is seen. Left inguinal hernia containing fat
is seen.

Musculoskeletal: Degenerative changes are noted at L5-S1 level with
encroachment of neural foramina by bony spurs.
IMPRESSION: There is no evidence of intestinal obstruction or pneumoperitoneum.
Appendix is not dilated. There is no hydronephrosis.

Fatty liver. Diverticulosis of colon without focal acute
diverticulitis. Enlarged spleen.

Other findings as described in the body of the report.

## 2024-01-28 NOTE — Progress Notes (Signed)
 Carelink Summary Report / Loop Recorder

## 2024-02-02 ENCOUNTER — Ambulatory Visit (INDEPENDENT_AMBULATORY_CARE_PROVIDER_SITE_OTHER): Payer: BC Managed Care – PPO

## 2024-02-02 DIAGNOSIS — G459 Transient cerebral ischemic attack, unspecified: Secondary | ICD-10-CM

## 2024-02-03 LAB — CUP PACEART REMOTE DEVICE CHECK
Date Time Interrogation Session: 20250413230635
Implantable Pulse Generator Implant Date: 20211123

## 2024-03-08 ENCOUNTER — Ambulatory Visit (INDEPENDENT_AMBULATORY_CARE_PROVIDER_SITE_OTHER): Payer: BC Managed Care – PPO

## 2024-03-08 ENCOUNTER — Ambulatory Visit: Payer: Self-pay | Admitting: Cardiology

## 2024-03-08 DIAGNOSIS — G459 Transient cerebral ischemic attack, unspecified: Secondary | ICD-10-CM

## 2024-03-08 LAB — CUP PACEART REMOTE DEVICE CHECK
Date Time Interrogation Session: 20250518230656
Implantable Pulse Generator Implant Date: 20211123

## 2024-03-18 NOTE — Addendum Note (Signed)
 Addended by: Edra Govern D on: 03/18/2024 09:13 AM   Modules accepted: Orders

## 2024-03-18 NOTE — Progress Notes (Signed)
 Carelink Summary Report / Loop Recorder

## 2024-04-07 DIAGNOSIS — R351 Nocturia: Secondary | ICD-10-CM | POA: Diagnosis not present

## 2024-04-07 DIAGNOSIS — R3121 Asymptomatic microscopic hematuria: Secondary | ICD-10-CM | POA: Diagnosis not present

## 2024-04-07 DIAGNOSIS — N5201 Erectile dysfunction due to arterial insufficiency: Secondary | ICD-10-CM | POA: Diagnosis not present

## 2024-04-07 DIAGNOSIS — N401 Enlarged prostate with lower urinary tract symptoms: Secondary | ICD-10-CM | POA: Diagnosis not present

## 2024-04-08 ENCOUNTER — Encounter

## 2024-04-08 ENCOUNTER — Ambulatory Visit: Payer: Self-pay | Admitting: Cardiology

## 2024-04-08 ENCOUNTER — Ambulatory Visit (INDEPENDENT_AMBULATORY_CARE_PROVIDER_SITE_OTHER)

## 2024-04-08 DIAGNOSIS — G459 Transient cerebral ischemic attack, unspecified: Secondary | ICD-10-CM | POA: Diagnosis not present

## 2024-04-08 LAB — CUP PACEART REMOTE DEVICE CHECK
Date Time Interrogation Session: 20250618231137
Implantable Pulse Generator Implant Date: 20211123

## 2024-04-21 NOTE — Progress Notes (Signed)
 Carelink Summary Report / Loop Recorder

## 2024-05-10 ENCOUNTER — Ambulatory Visit

## 2024-05-10 DIAGNOSIS — G459 Transient cerebral ischemic attack, unspecified: Secondary | ICD-10-CM

## 2024-05-11 LAB — CUP PACEART REMOTE DEVICE CHECK
Date Time Interrogation Session: 20250720230921
Implantable Pulse Generator Implant Date: 20211123

## 2024-05-13 ENCOUNTER — Encounter

## 2024-05-19 ENCOUNTER — Ambulatory Visit: Payer: Self-pay | Admitting: Cardiology

## 2024-05-31 ENCOUNTER — Encounter: Payer: Self-pay | Admitting: Podiatry

## 2024-05-31 ENCOUNTER — Ambulatory Visit (INDEPENDENT_AMBULATORY_CARE_PROVIDER_SITE_OTHER)

## 2024-05-31 ENCOUNTER — Ambulatory Visit: Admitting: Podiatry

## 2024-05-31 DIAGNOSIS — M109 Gout, unspecified: Secondary | ICD-10-CM

## 2024-05-31 DIAGNOSIS — M7751 Other enthesopathy of right foot: Secondary | ICD-10-CM | POA: Diagnosis not present

## 2024-05-31 MED ORDER — TRIAMCINOLONE ACETONIDE 10 MG/ML IJ SUSP
10.0000 mg | Freq: Once | INTRAMUSCULAR | Status: AC
  Administered 2024-05-31 (×2): 10 mg via INTRA_ARTICULAR

## 2024-06-02 ENCOUNTER — Ambulatory Visit: Admitting: Podiatry

## 2024-06-02 NOTE — Progress Notes (Signed)
 Subjective:   Patient ID: Gabriel Lin, male   DOB: 63 y.o.   MRN: [id number]   HPI Patient presents with a lot of pain in his right ankle and states he seems to get 2 attacks a year and takes 200 mg of allopurinol  a day   ROS      Objective:  Physical Exam  Neurovascular status intact with inflammation of the right sinus tarsi ankle with fluid buildup around the area with patient taking allopurinol  was placed on steroid pack which helped some and does have colchicine      Assessment:  Inflammatory condition probability for gout right sinus tarsi ankle joint acute     Plan:  H NP reviewed today sterile prep and injected the sinus tarsi ankle 3 mg Kenalog  5 mg Xylocaine  discussed using colchicine  to try to help with acute attacks continue to monitor diet and may need other treatment in future  X-rays were negative for other pathology associated with condition appears to be soft tissue not arthritis or fracture

## 2024-06-08 NOTE — Progress Notes (Signed)
 Carelink Summary Report / Loop Recorder

## 2024-06-10 ENCOUNTER — Ambulatory Visit: Payer: Self-pay | Admitting: Cardiology

## 2024-06-10 ENCOUNTER — Ambulatory Visit (INDEPENDENT_AMBULATORY_CARE_PROVIDER_SITE_OTHER)

## 2024-06-10 DIAGNOSIS — G459 Transient cerebral ischemic attack, unspecified: Secondary | ICD-10-CM

## 2024-06-10 LAB — CUP PACEART REMOTE DEVICE CHECK
Date Time Interrogation Session: 20250820230654
Implantable Pulse Generator Implant Date: 20211123

## 2024-06-17 ENCOUNTER — Encounter

## 2024-07-06 ENCOUNTER — Encounter: Payer: Self-pay | Admitting: Internal Medicine

## 2024-07-12 ENCOUNTER — Ambulatory Visit (INDEPENDENT_AMBULATORY_CARE_PROVIDER_SITE_OTHER)

## 2024-07-12 ENCOUNTER — Ambulatory Visit: Payer: Self-pay | Admitting: Cardiology

## 2024-07-12 DIAGNOSIS — G459 Transient cerebral ischemic attack, unspecified: Secondary | ICD-10-CM | POA: Diagnosis not present

## 2024-07-12 LAB — CUP PACEART REMOTE DEVICE CHECK
Date Time Interrogation Session: 20250921230630
Implantable Pulse Generator Implant Date: 20211123

## 2024-07-13 NOTE — Progress Notes (Signed)
 Remote Loop Recorder Transmission

## 2024-07-22 ENCOUNTER — Encounter

## 2024-07-22 ENCOUNTER — Encounter: Payer: Self-pay | Admitting: Internal Medicine

## 2024-07-23 NOTE — Progress Notes (Signed)
 Remote Loop Recorder Transmission

## 2024-08-10 ENCOUNTER — Encounter

## 2024-08-12 ENCOUNTER — Ambulatory Visit: Attending: Cardiology

## 2024-08-12 ENCOUNTER — Encounter

## 2024-08-12 DIAGNOSIS — G459 Transient cerebral ischemic attack, unspecified: Secondary | ICD-10-CM | POA: Diagnosis not present

## 2024-08-16 ENCOUNTER — Ambulatory Visit: Payer: Self-pay | Admitting: Cardiology

## 2024-08-16 LAB — CUP PACEART REMOTE DEVICE CHECK
Date Time Interrogation Session: 20251022231000
Implantable Pulse Generator Implant Date: 20211123

## 2024-08-17 NOTE — Progress Notes (Signed)
 Remote Loop Recorder Transmission

## 2024-08-19 ENCOUNTER — Ambulatory Visit (AMBULATORY_SURGERY_CENTER)

## 2024-08-19 VITALS — Ht 72.0 in | Wt 223.0 lb

## 2024-08-19 DIAGNOSIS — Z860100 Personal history of colon polyps, unspecified: Secondary | ICD-10-CM

## 2024-08-19 DIAGNOSIS — J019 Acute sinusitis, unspecified: Secondary | ICD-10-CM | POA: Diagnosis not present

## 2024-08-19 DIAGNOSIS — Z800 Family history of malignant neoplasm of digestive organs: Secondary | ICD-10-CM

## 2024-08-19 MED ORDER — NA SULFATE-K SULFATE-MG SULF 17.5-3.13-1.6 GM/177ML PO SOLN: 1.0000 | Freq: Once | ORAL | 0 refills | Status: AC

## 2024-08-19 NOTE — Progress Notes (Signed)
 No egg or soy allergy known to patient  No issues known to pt with past sedation with any surgeries or procedures Patient denies ever being told they had issues or difficulty with intubation  No FH of Malignant Hyperthermia  Pt is not on diet pills- On Zepbound for wt loss only, Hold instructions provided  Pt is not on  home 02  Pt is not on blood thinners  Pt denies issues with constipation  No A fib or A flutter Have any cardiac testing pending--No Pt can ambulate  Pt denies use of chewing tobacco Discussed diabetic I weight loss medication holds Discussed NSAID holds Checked BMI Pt instructed to use Singlecare.com or GoodRx for a price reduction on prep  Patient's chart reviewed  Pre visit completed

## 2024-08-26 ENCOUNTER — Encounter: Payer: Self-pay | Admitting: Internal Medicine

## 2024-08-30 ENCOUNTER — Encounter: Payer: Self-pay | Admitting: Internal Medicine

## 2024-09-01 NOTE — Progress Notes (Signed)
 [facility] History and Physical   Primary Care Physician:  Ezzard Elsie NOVAK, MD   Reason for Procedure:    Encounter Diagnoses  Name Primary?   Hx of adenomatous colonic polyps Yes   Family history of colon cancer      Plan:    Colonoscopy     HPI: Gabriel Lin is a 63 y.o. male with a history of adenomatous colon polyps, his brother had colon cancer before the age of 83, also.  Last colonoscopy in 2022 with adenomas removed and he is here for surveillance exam.  Also had diverticulosis.   Past Medical History:  Diagnosis Date   Cataract    early   Chronic kidney disease    stage III- stable   COVID    GERD (gastroesophageal reflux disease)    Hyperlipidemia    no meds taken   Hypertension    PFO (patent foramen ovale)    Sleep apnea    wears CPA   Stroke (HCC) 09/12/2020   TIA (transient ischemic attack)     Past Surgical History:  Procedure Laterality Date   BUBBLE STUDY  09/12/2020   Procedure: BUBBLE STUDY;  Surgeon: Lonni Slain, MD;  Location: [facility] ENDOSCOPY;  Service: Cardiovascular;;   COLONOSCOPY     HERNIA REPAIR     double hernia repair- 2005 ?   LAMINECTOMY     LOOP RECORDER INSERTION N/A 09/12/2020   Procedure: LOOP RECORDER INSERTION;  Surgeon: Kelsie Agent, MD;  Location: [facility] INVASIVE CV LAB;  Service: Cardiovascular;  Laterality: N/A;   POLYPECTOMY     TEE WITHOUT CARDIOVERSION N/A 09/12/2020   Procedure: TRANSESOPHAGEAL ECHOCARDIOGRAM (TEE);  Surgeon: Lonni Slain, MD;  Location: [facility] ENDOSCOPY;  Service: Cardiovascular;  Laterality: N/A;     Current Outpatient Medications  Medication Sig Dispense Refill   allopurinol  (ZYLOPRIM ) 100 MG tablet Take 100 mg by mouth daily.     aspirin  81 MG chewable tablet Chew by mouth daily.     Aspirin -Acetaminophen -Caffeine (GOODY HEADACHE PO) Take by mouth. As needed     atenolol  (TENORMIN ) 50 MG tablet Take 50 mg by mouth daily.      ergocalciferol (VITAMIN D2) 1.25 MG  (50000 UT) capsule Take 50,000 Units by mouth every 14 (fourteen) days.     esomeprazole (NEXIUM) 20 MG capsule Take 20 mg by mouth daily at 12 noon.     rosuvastatin (CRESTOR) 5 MG tablet Take 5 mg by mouth daily.     co-enzyme Q-10 30 MG capsule Take by mouth.     colchicine  0.6 MG tablet TAKE 1 TABLET BY MOUTH EVERY DAY (Patient taking differently: Take 0.6 mg by mouth daily. Takes as needed) 90 tablet 1   dicyclomine  (BENTYL ) 20 MG tablet Take 1 tablet (20 mg total) by mouth 2 (two) times daily. 20 tablet 0   tirzepatide (ZEPBOUND) 7.5 MG/0.5ML Pen Inject 7.5 mg into the skin once a week. Up to 12.5mg  weekly     Current Facility-Administered Medications  Medication Dose Route Frequency Provider Last Rate Last Admin   0.9 %  sodium chloride  infusion  500 mL Intravenous Once Avram Lupita BRAVO, MD        Allergies as of 09/02/2024   (No Known Allergies)    Family History  Problem Relation Age of Onset   Colon cancer Brother 29   Colon polyps Brother    Esophageal cancer Neg Hx    Rectal cancer Neg Hx    Stomach cancer Neg Hx  Social History   Socioeconomic History   Marital status: Married    Spouse name: Jacquelynne   Number of children: Not on file   Years of education: Not on file   Highest education level: Not on file  Occupational History   Not on file  Tobacco Use   Smoking status: Former   Smokeless tobacco: Never   Tobacco comments:    2012 quit  Vaping Use   Vaping status: Some Days  Substance and Sexual Activity   Alcohol use: Not Currently   Drug use: Never   Sexual activity: Not on file  Other Topics Concern   Not on file  Social History Narrative   Lives with wife   Right handed   Drinks 1-2 cups daily caffeine   Social Drivers of Corporate Investment Banker Strain: Not on file  Food Insecurity: Not on file  Transportation Needs: Not on file  Physical Activity: Not on file  Stress: Not on file  Social Connections: Not on file  Intimate Partner  Violence: Not on file    Review of Systems:  All other review of systems negative except as mentioned in the HPI.  Physical Exam: Vital signs BP (!) 182/110   Pulse 82   Temp 98.4 F (36.9 C) (Temporal)   Resp 14   Ht 6' (1.829 m)   Wt 223 lb (101.2 kg)   SpO2 99%   BMI 30.24 kg/m   General:   Alert,  Well-developed, well-nourished, pleasant and cooperative in NAD Lungs:  Clear throughout to auscultation.   Heart:  Regular rate and rhythm; no murmurs, clicks, rubs,  or gallops. Abdomen:  Soft, nontender and nondistended. Normal bowel sounds.   Neuro/Psych:  Alert and cooperative. Normal mood and affect. A and O x 3   @Yanelie Abraha  CHARLENA Commander, MD, [facility] [phone] (pager) 09/02/2024 10:02 AM@

## 2024-09-02 ENCOUNTER — Ambulatory Visit (AMBULATORY_SURGERY_CENTER): Admitting: Internal Medicine

## 2024-09-02 ENCOUNTER — Encounter: Payer: Self-pay | Admitting: Internal Medicine

## 2024-09-02 VITALS — BP 121/74 | HR 82 | Temp 98.4°F | Resp 13 | Ht 72.0 in | Wt 223.0 lb

## 2024-09-02 DIAGNOSIS — K648 Other hemorrhoids: Secondary | ICD-10-CM

## 2024-09-02 DIAGNOSIS — Z860101 Personal history of adenomatous and serrated colon polyps: Secondary | ICD-10-CM | POA: Diagnosis not present

## 2024-09-02 DIAGNOSIS — D120 Benign neoplasm of cecum: Secondary | ICD-10-CM | POA: Diagnosis not present

## 2024-09-02 DIAGNOSIS — Z1211 Encounter for screening for malignant neoplasm of colon: Secondary | ICD-10-CM | POA: Diagnosis present

## 2024-09-02 DIAGNOSIS — K5730 Diverticulosis of large intestine without perforation or abscess without bleeding: Secondary | ICD-10-CM | POA: Diagnosis not present

## 2024-09-02 DIAGNOSIS — Z800 Family history of malignant neoplasm of digestive organs: Secondary | ICD-10-CM | POA: Diagnosis not present

## 2024-09-02 MED ORDER — SODIUM CHLORIDE 0.9 % IV SOLN: 500.0000 mL | Freq: Once | INTRAVENOUS | Status: DC

## 2024-09-02 NOTE — Progress Notes (Signed)
 Pt's states no medical or surgical changes since previsit or office visit.

## 2024-09-02 NOTE — Progress Notes (Signed)
 Called to room to assist during endoscopic procedure.  Patient ID and intended procedure confirmed with present staff. Received instructions for my participation in the procedure from the performing physician.

## 2024-09-02 NOTE — Op Note (Signed)
 [facility] Patient Name: Gabriel Lin Procedure Date: 09/02/2024 10:00 AM MRN: [id number] Endoscopist: Lupita FORBES Commander , MD, [id number] Age: 63 Referring MD:  Date of Birth: 07/10/1961 Gender: Male Account #: [id number] Procedure:                Colonoscopy Indications:              Surveillance: Personal history of adenomatous                            polyps on last colonoscopy 3 years ago, Last                            colonoscopy: 2022 Medicines:                Monitored Anesthesia Care Procedure:                Pre-Anesthesia Assessment:                           - Prior to the procedure, a History and Physical                            was performed, and patient medications and                            allergies were reviewed. The patient's tolerance of                            previous anesthesia was also reviewed. The risks                            and benefits of the procedure and the sedation                            options and risks were discussed with the patient.                            All questions were answered, and informed consent                            was obtained. Prior Anticoagulants: The patient has                            taken no anticoagulant or antiplatelet agents. ASA                            Grade Assessment: III - A patient with severe                            systemic disease. After reviewing the risks and                            benefits, the patient was deemed in satisfactory  condition to undergo the procedure.                           After obtaining informed consent, the colonoscope                            was passed under direct vision. Throughout the                            procedure, the patient's blood pressure, pulse, and                            oxygen saturations were monitored continuously. The                            Olympus Scope SN [id number] was introduced  through the                            anus and advanced to the the cecum, identified by                            appendiceal orifice and ileocecal valve. The                            colonoscopy was performed without difficulty. The                            patient tolerated the procedure well. The quality                            of the bowel preparation was adequate. The                            ileocecal valve, appendiceal orifice, and rectum                            were photographed. The bowel preparation used was                            SUPREP via split dose instruction. Scope In: 10:11:36 AM Scope Out: 10:24:17 AM Scope Withdrawal Time: 0 hours 11 minutes 22 seconds  Total Procedure Duration: 0 hours 12 minutes 41 seconds  Findings:                 Hemorrhoids were found on perianal exam.                           A 6 mm polyp was found in the cecum. The polyp was                            sessile. The polyp was removed with a cold snare.                            Resection and retrieval were complete.  Verification                            of patient identification for the specimen was                            done. Estimated blood loss was minimal.                           Multiple diverticula were found in the sigmoid                            colon.                           The exam was otherwise without abnormality on                            direct and retroflexion views. Complications:            No immediate complications. Estimated Blood Loss:     Estimated blood loss was minimal. Impression:               - Hemorrhoids found on perianal exam.                           - One 6 mm polyp in the cecum, removed with a cold                            snare. Resected and retrieved.                           - Diverticulosis in the sigmoid colon.                           - The examination was otherwise normal on direct                            and  retroflexion views.                           - Personal history of colonic polyps. 06/2020 10                            adenomas                           07/19/2021 5 diminutive polyps - mix adenomas and                            hyperplastics                           - Brother had CRCA before age 33 Recommendation:           - Patient has a contact number available for  emergencies. The signs and symptoms of potential                            delayed complications were discussed with the                            patient. Return to normal activities tomorrow.                            Written discharge instructions were provided to the                            patient.                           - Resume previous diet.                           - Continue present medications.                           - Repeat colonoscopy is recommended for                            surveillance. The colonoscopy date will be                            determined after pathology results from today's                            exam become available for review.                           - MY OFFICE WILL MAKE A REFERRAL TO GENETICS - > 10                            ADENOMA LIFETIME AND BROTHER HAD COLON CANCER Lupita FORBES Commander, MD 09/02/2024 10:32:37 AM This report has been signed electronically.

## 2024-09-02 NOTE — Progress Notes (Signed)
 Report given to PACU, vss

## 2024-09-02 NOTE — Patient Instructions (Addendum)
 I found and removed 1 small polyp.  I will have that analyzed as usual and make a recommendation about when to repeat a colonoscopy.    My office will make a referral to the genetics counselor as we discussed, given the number of precancerous polyps you had in your lifetime plus the family history of colon cancer, there could be some sort of genetic abnormality predisposing to polyps and possibly cancer.    You also have a condition called diverticulosis - common and not usually a problem. Please read the handout provided.  I appreciate the opportunity to care for you. Lupita CHARLENA Commander, MD, FACG  YOU HAD AN ENDOSCOPIC PROCEDURE TODAY AT THE [facility]:   Refer to the procedure report that was given to you for any specific questions about what was found during the examination.  If the procedure report does not answer your questions, please call your gastroenterologist to clarify.  If you requested that your care partner not be given the details of your procedure findings, then the procedure report has been included in a sealed envelope for you to review at your convenience later.  YOU SHOULD EXPECT: Some feelings of bloating in the abdomen. Passage of more gas than usual.  Walking can help get rid of the air that was put into your GI tract during the procedure and reduce the bloating. If you had a lower endoscopy (such as a colonoscopy or flexible sigmoidoscopy) you may notice spotting of blood in your stool or on the toilet paper. If you underwent a bowel prep for your procedure, you may not have a normal bowel movement for a few days.  Please Note:  You might notice some irritation and congestion in your nose or some drainage.  This is from the oxygen used during your procedure.  There is no need for concern and it should clear up in a day or so.  SYMPTOMS TO REPORT IMMEDIATELY:  Following lower endoscopy (colonoscopy or flexible sigmoidoscopy):  Excessive amounts of blood in the  stool  Significant tenderness or worsening of abdominal pains  Swelling of the abdomen that is new, acute  Fever of 100F or higher   For urgent or emergent issues, a gastroenterologist can be reached at any hour by calling [phone]. Do not use MyChart messaging for urgent concerns.    DIET:  We do recommend a small meal at first, but then you may proceed to your regular diet.  Drink plenty of fluids but you should avoid alcoholic beverages for 24 hours.  ACTIVITY:  You should plan to take it easy for the rest of today and you should NOT DRIVE or use heavy machinery until tomorrow (because of the sedation medicines used during the test).    FOLLOW UP: Our staff will call the number listed on your records the next business day following your procedure.  We will call around 7:15- 8:00 am to check on you and address any questions or concerns that you may have regarding the information given to you following your procedure. If we do not reach you, we will leave a message.     If any biopsies were taken you will be contacted by phone or by letter within the next 1-3 weeks.  Please call us  at [phone] if you have not heard about the biopsies in 3 weeks.    SIGNATURES/CONFIDENTIALITY: You and/or your care partner have signed paperwork which will be entered into your electronic medical record.  These signatures  attest to the fact that that the information above on your After Visit Summary has been reviewed and is understood.  Full responsibility of the confidentiality of this discharge information lies with you and/or your care-partner.

## 2024-09-03 ENCOUNTER — Telehealth: Payer: Self-pay

## 2024-09-03 ENCOUNTER — Telehealth: Payer: Self-pay | Admitting: *Deleted

## 2024-09-03 DIAGNOSIS — Z860101 Personal history of adenomatous and serrated colon polyps: Secondary | ICD-10-CM

## 2024-09-03 DIAGNOSIS — Z800 Family history of malignant neoplasm of digestive organs: Secondary | ICD-10-CM

## 2024-09-03 NOTE — Telephone Encounter (Signed)
 Referral placed to Genetics at [facility] and I left him a detailed voicemail message about this and for him to contact us  back if he doesn't hear in a timely fashion.

## 2024-09-03 NOTE — Telephone Encounter (Signed)
 Post procedure follow up call placed, no answer and left VM.

## 2024-09-06 LAB — SURGICAL PATHOLOGY

## 2024-09-10 ENCOUNTER — Encounter

## 2024-09-10 NOTE — Telephone Encounter (Signed)
 I can see that the genetics department has tried to call him twice to set up an appointment. I called and left him a voice mail to please call them back.

## 2024-09-12 ENCOUNTER — Ambulatory Visit

## 2024-09-12 ENCOUNTER — Ambulatory Visit: Payer: Self-pay | Admitting: Internal Medicine

## 2024-09-12 DIAGNOSIS — G459 Transient cerebral ischemic attack, unspecified: Secondary | ICD-10-CM

## 2024-09-12 DIAGNOSIS — Z860101 Personal history of adenomatous and serrated colon polyps: Secondary | ICD-10-CM

## 2024-09-13 ENCOUNTER — Encounter

## 2024-09-13 LAB — CUP PACEART REMOTE DEVICE CHECK
Date Time Interrogation Session: 20251122230550
Implantable Pulse Generator Implant Date: 20211123

## 2024-09-14 NOTE — Progress Notes (Signed)
 Remote Loop Recorder Transmission

## 2024-09-15 ENCOUNTER — Ambulatory Visit: Payer: Self-pay | Admitting: Cardiology

## 2024-09-29 NOTE — Telephone Encounter (Signed)
 I see in epic patient has appointment set up for 11/11/2024 with genetics.

## 2024-10-11 ENCOUNTER — Encounter

## 2024-10-13 ENCOUNTER — Ambulatory Visit

## 2024-10-13 DIAGNOSIS — G459 Transient cerebral ischemic attack, unspecified: Secondary | ICD-10-CM | POA: Diagnosis not present

## 2024-10-13 LAB — CUP PACEART REMOTE DEVICE CHECK
Date Time Interrogation Session: 20251223230505
Implantable Pulse Generator Implant Date: 20211123

## 2024-10-14 ENCOUNTER — Encounter

## 2024-10-15 ENCOUNTER — Ambulatory Visit: Payer: Self-pay | Admitting: Cardiology

## 2024-10-15 NOTE — Progress Notes (Signed)
 Remote Loop Recorder Transmission

## 2024-11-11 ENCOUNTER — Inpatient Hospital Stay

## 2024-11-11 ENCOUNTER — Encounter

## 2024-11-11 ENCOUNTER — Telehealth: Payer: Self-pay | Admitting: Internal Medicine

## 2024-11-11 ENCOUNTER — Inpatient Hospital Stay: Attending: Genetic Counselor | Admitting: Genetic Counselor

## 2024-11-11 NOTE — Telephone Encounter (Signed)
 Good Afternoon, this is courteous notification of patient being No Show for Genetic Referral appointment today at 9:00am. Thank You and Take Care

## 2024-11-13 ENCOUNTER — Ambulatory Visit: Attending: Cardiology

## 2024-11-13 DIAGNOSIS — G459 Transient cerebral ischemic attack, unspecified: Secondary | ICD-10-CM | POA: Diagnosis not present

## 2024-11-15 ENCOUNTER — Ambulatory Visit

## 2024-11-15 ENCOUNTER — Encounter

## 2024-11-15 LAB — CUP PACEART REMOTE DEVICE CHECK
Date Time Interrogation Session: 20260123230245
Implantable Pulse Generator Implant Date: 20211123

## 2024-11-16 ENCOUNTER — Ambulatory Visit: Payer: Self-pay | Admitting: Cardiology

## 2024-11-18 NOTE — Progress Notes (Signed)
 Remote Loop Recorder Transmission

## 2024-12-12 ENCOUNTER — Encounter

## 2024-12-14 ENCOUNTER — Ambulatory Visit

## 2024-12-16 ENCOUNTER — Ambulatory Visit

## 2025-01-14 ENCOUNTER — Ambulatory Visit

## 2025-01-17 ENCOUNTER — Ambulatory Visit

## 2025-02-14 ENCOUNTER — Ambulatory Visit

## 2025-02-17 ENCOUNTER — Ambulatory Visit

## 2025-03-17 ENCOUNTER — Ambulatory Visit

## 2025-03-21 ENCOUNTER — Ambulatory Visit
